# Patient Record
Sex: Male | Born: 1965
Health system: Southern US, Community
[De-identification: ages and names within clinical notes are randomized; demographics above are authoritative.]

## PROBLEM LIST (undated history)

## (undated) DIAGNOSIS — J449 Chronic obstructive pulmonary disease, unspecified: Secondary | ICD-10-CM

## (undated) DIAGNOSIS — K649 Unspecified hemorrhoids: Secondary | ICD-10-CM

## (undated) HISTORY — PX: NO PAST SURGERIES: SHX2092

## (undated) HISTORY — DX: Unspecified hemorrhoids: K64.9

---

## 2017-03-23 ENCOUNTER — Encounter: Payer: Self-pay | Admitting: Nurse Practitioner

## 2017-03-23 ENCOUNTER — Ambulatory Visit (INDEPENDENT_AMBULATORY_CARE_PROVIDER_SITE_OTHER): Payer: Commercial Managed Care - PPO | Admitting: Nurse Practitioner

## 2017-03-23 ENCOUNTER — Ambulatory Visit
Admission: RE | Admit: 2017-03-23 | Discharge: 2017-03-23 | Disposition: A | Discharge status: Home / Self Care | Payer: Self-pay | Source: Ambulatory Visit | Attending: Nurse Practitioner | Admitting: Nurse Practitioner

## 2017-03-23 VITALS — BP 116/69 | HR 70 | Temp 97.9°F | Resp 16 | Wt 130.6 lb

## 2017-03-23 DIAGNOSIS — R05 Cough: Secondary | ICD-10-CM

## 2017-03-23 DIAGNOSIS — R61 Generalized hyperhidrosis: Secondary | ICD-10-CM | POA: Diagnosis not present

## 2017-03-23 DIAGNOSIS — H65191 Other acute nonsuppurative otitis media, right ear: Secondary | ICD-10-CM

## 2017-03-23 DIAGNOSIS — Z23 Encounter for immunization: Secondary | ICD-10-CM | POA: Diagnosis not present

## 2017-03-23 DIAGNOSIS — R053 Chronic cough: Secondary | ICD-10-CM

## 2017-03-23 DIAGNOSIS — R918 Other nonspecific abnormal finding of lung field: Secondary | ICD-10-CM | POA: Insufficient documentation

## 2017-03-23 DIAGNOSIS — H7292 Unspecified perforation of tympanic membrane, left ear: Secondary | ICD-10-CM | POA: Insufficient documentation

## 2017-03-23 DIAGNOSIS — F17210 Nicotine dependence, cigarettes, uncomplicated: Secondary | ICD-10-CM | POA: Diagnosis not present

## 2017-03-23 DIAGNOSIS — G44321 Chronic post-traumatic headache, intractable: Secondary | ICD-10-CM

## 2017-03-23 DIAGNOSIS — Z7689 Persons encountering health services in other specified circumstances: Secondary | ICD-10-CM

## 2017-03-23 LAB — CBC WITH DIFFERENTIAL/PLATELET
Basophils Absolute: 32 cells/uL (ref 0–200)
Basophils Relative: 0.5 %
Eosinophils Absolute: 202 cells/uL (ref 15–500)
Eosinophils Relative: 3.2 %
HCT: 43.7 % (ref 38.5–50.0)
Hemoglobin: 14.9 g/dL (ref 13.2–17.1)
Lymphs Abs: 2356 cells/uL (ref 850–3900)
MCH: 32 pg (ref 27.0–33.0)
MCHC: 34.1 g/dL (ref 32.0–36.0)
MCV: 94 fL (ref 80.0–100.0)
MPV: 11.8 fL (ref 7.5–12.5)
Monocytes Relative: 6.3 %
Neutro Abs: 3314 cells/uL (ref 1500–7800)
Neutrophils Relative %: 52.6 %
Platelets: 196 10*3/uL (ref 140–400)
RBC: 4.65 10*6/uL (ref 4.20–5.80)
RDW: 13.3 % (ref 11.0–15.0)
Total Lymphocyte: 37.4 %
WBC mixed population: 397 cells/uL (ref 200–950)
WBC: 6.3 10*3/uL (ref 3.8–10.8)

## 2017-03-23 LAB — COMPREHENSIVE METABOLIC PANEL
AG Ratio: 1.6 (calc) (ref 1.0–2.5)
ALT: 12 U/L (ref 9–46)
AST: 18 U/L (ref 10–35)
Albumin: 4.2 g/dL (ref 3.6–5.1)
Alkaline phosphatase (APISO): 45 U/L (ref 40–115)
BUN: 17 mg/dL (ref 7–25)
CO2: 27 mmol/L (ref 20–32)
Calcium: 9.7 mg/dL (ref 8.6–10.3)
Chloride: 107 mmol/L (ref 98–110)
Creat: 0.87 mg/dL (ref 0.70–1.33)
Globulin: 2.7 g/dL (calc) (ref 1.9–3.7)
Glucose, Bld: 82 mg/dL (ref 65–139)
Potassium: 3.7 mmol/L (ref 3.5–5.3)
Sodium: 142 mmol/L (ref 135–146)
Total Bilirubin: 0.3 mg/dL (ref 0.2–1.2)
Total Protein: 6.9 g/dL (ref 6.1–8.1)

## 2017-03-23 MED ORDER — DOXYCYCLINE HYCLATE 100 MG PO TABS: 100.0000 mg | ORAL_TABLET | Freq: Two times a day (BID) | ORAL | 0 refills | Status: DC
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
## 2017-03-23 NOTE — Patient Instructions (Addendum)
Melvin Ramos, Thank you for coming in to clinic today.  1. For your headaches: - Start taking Tylenol extra strength 1 to 2 tablets every 6-8 hours for aches or fever/chills for next few days as needed.  Do not take more than 3,000 mg in 24 hours from all medicines.  May take Ibuprofen as well if tolerated 200-400mg  every 8 hours as needed. - We are also sending a referral to neurology for additional headache evaluation.  2. For your cough: - Chest Xray today - Tb skin test was placed today.  Please return on Friday after 9:30 am to have this read.  3. You have no hearing in your left ear because of a chronically ruptured ear drum.    4. You also have an infection of your right ear.  Take doxycycline 100 mg tablet twice daily (about every 12 hours) for 10 days.  Please schedule a follow-up appointment with Cassell Smiles, AGNP. Return in about 2 days (around 03/25/2017) for Tb skin test check AND in about 3 months for annual physical.  If you have any other questions or concerns, please feel free to call the clinic or send a message through [place]. You may also schedule an earlier appointment if necessary.  You will receive a survey after today's visit either digitally by e-mail or paper by [employer]. Your experiences and feedback matter to [place].  Please respond so we know how we are doing as we provide care for you.   Cassell Smiles, DNP, AGNP-BC Adult Gerontology Nurse Practitioner [facility]    Eardrum Rupture, Adult An eardrum rupture is a hole (perforation) in the eardrum. The eardrum is a thin, round tissue inside of the ear that separates the ear canal from the middle ear. The eardrum is also called the tympanic membrane. It transfers sound vibrations through small bones in the middle ear to the hearing nerve in the inner ear. It also protects the middle ear from germs. An eardrum rupture can cause pain and hearing loss. What are the causes? This condition may be  caused by:  An infection.  A sudden injury, such as from: ? Inserting a thin, sharp object into the ear. ? A hit to the side of the head, especially by an open hand. ? Falling onto water or a flat surface. ? A rapid change in pressure, such as from flying or scuba diving. ? A sudden increase in pressure against the eardrum, such as from an explosion or a very loud noise.  Inserting a cotton-tipped swab in the ear.  A long-term eustachian tube disorder. Eustachian tubes are parts of the body that connect each middle ear space to the back of the nose.  A medical procedure or surgery, such as a procedure to remove wax from the ear canal.  Removing a man-made pressure equalization tube(PE tube) that was placed through the eardrum.  Having a PE tube fall out.  What increases the risk? You are more likely to develop this condition if:  You have had PE tubes inserted in your ears.  You have an ear infection.  You play sports that: ? Involve balls or contact with other players. ? Take place in water, such as diving, scuba diving, or waterskiing.  What are the signs or symptoms? Symptoms of this condition include:  Sudden pain at the time of the injury.  Ear pain that suddenly improves.  Ringing in the ear after the injury.  Drainage from the ear. The drainage  may be clear, cloudy or pus-like, or bloody.  Hearing loss.  Dizziness.  How is this diagnosed? This condition is diagnosed based on your symptoms and medical history as well as a physical exam. Your health care provider can usually see a perforation using an ear scope (otoscope). You may have tests, such as:  A hearing test (audiogram) to check for hearing loss.  A test in which a sample of ear drainage is tested for infection (culture).  How is this treated? An eardrum typically heals on its own within a few weeks. If your eardrum does not heal, your health care provider may recommend a procedure to place a patch  over your eardrum or surgery to repair your eardrum. Your health care provider may also prescribe antibiotic medicines to help prevent infection. If the ear heals completely, any hearing loss should be temporary. Follow these instructions at home:  Keep your ear dry. This is very important. Follow instructions from your health care provider about how to keep your ear dry. You may need to wear waterproof earplugs when bathing and swimming.  Take over-the-counter and prescription medicines only as told by your health care provider.  Return to sports and activities as told by your health care provider. Ask your health care provider what activities are safe for you.  Wear headgear with ear protection when you play sports in which ear injuries are common.  If directed, apply heat to your affected ear as often as told by your health care provider. Use the heat source that your health care provider recommends, such as a moist heat pack or a heating pad. This will help to relieve pain. ? Place a towel between your skin and the heat source. ? Leave the heat on for 20-30 minutes. ? Remove the heat if your skin turns bright red. This is especially important if you are unable to feel pain, heat, or cold. You may have a greater risk of getting burned.  Keep all follow-up visits as told by your health care provider. This is important.  Talk to your health care provider before traveling by plane. Contact a health care provider if:  You have mucus or blood draining from your ear.  You have a fever.  You have ear pain.  You have hearing loss, dizziness, or ringing in your ear. Get help right away if:  You have sudden hearing loss.  You are very dizzy.  You have severe ear pain.  Your face feels weak or becomes paralyzed. Summary  An eardrum rupture is a hole (perforation) in the eardrum that can cause pain and hearing loss. It is usually caused by a sudden injury to the ear.  The eardrum  will likely heal on its own within a few weeks. In some cases, surgery may be necessary.  After the injury, follow instructions from your health care provider about how to keep your ear dry as it heals. This information is not intended to replace advice given to you by your health care provider. Make sure you discuss any questions you have with your health care provider. Document Released: 05/21/2000 Document Revised: 07/30/2016 Document Reviewed: 07/30/2016 Elsevier Interactive Patient Education  [employer].

## 2017-03-25 ENCOUNTER — Ambulatory Visit (INDEPENDENT_AMBULATORY_CARE_PROVIDER_SITE_OTHER): Payer: Commercial Managed Care - PPO | Admitting: Nurse Practitioner

## 2017-03-25 DIAGNOSIS — R7611 Nonspecific reaction to tuberculin skin test without active tuberculosis: Secondary | ICD-10-CM | POA: Diagnosis not present

## 2017-03-25 DIAGNOSIS — J410 Simple chronic bronchitis: Secondary | ICD-10-CM

## 2017-03-25 LAB — TB SKIN TEST
Induration: 22 mm
TB Skin Test: POSITIVE

## 2017-03-25 MED ORDER — ALBUTEROL SULFATE HFA 108 (90 BASE) MCG/ACT IN AERS: 1.0000 | INHALATION_SPRAY | Freq: Four times a day (QID) | RESPIRATORY_TRACT | 5 refills | Status: DC | PRN

## 2017-03-25 NOTE — Patient Instructions (Addendum)
Moe, Thank you for coming in to clinic today.  1. For your positive ppd/TB test, we are following up with a blood test to confirm any possible infection.  You will get a call for the results next week.  Please schedule a follow-up appointment with Cassell Smiles, AGNP. Return if symptoms worsen or fail to improve.  If you have any other questions or concerns, please feel free to call the clinic or send a message through [place]. You may also schedule an earlier appointment if necessary.  You will receive a survey after today's visit either digitally by e-mail or paper by [employer]. Your experiences and feedback matter to [place].  Please respond so we know how we are doing as we provide care for you.   Cassell Smiles, DNP, AGNP-BC Adult Gerontology Nurse Practitioner [place]

## 2017-03-25 NOTE — Progress Notes (Signed)
Subjective:    Patient ID: Melvin Ramos, male    DOB: 1965-08-13, 51 y.o.   MRN: [id number]  Melvin Ramos is a 51 y.o. male presenting on 03/25/2017 for PPD Reading   HPI PPD reading Pt was in clinic today for PPD reading w/ positive result.  PPD induration 22 mm left forearm.  Pt has no symptoms of night sweats or productive cough.  No contacts or people who live with Melvin Ramos have cough.  There has been no change in pt's symptoms over last 2 days.  Also requests recheck of ears.  Social History  Substance Use Topics  . Smoking status: Current Every Day Smoker    Packs/day: 1.00    Years: 30.00  . Smokeless tobacco: Never Used  . Alcohol use No    Review of Systems Per HPI unless specifically indicated above     Objective:    There were no vitals taken for this visit.  Wt Readings from Last 3 Encounters:  03/23/17 130 lb 9.6 oz (59.2 kg)    Physical Exam  General - healthy, well-appearing, NAD HEENT - Normocephalic, atraumatic, PERRL, EOMI, patent nares w/o congestion, oropharynx clear, MMM, Right TM erythematous but improving, Left TM less erythematous and w/ persistent chronic TM rupture w/o hearing.  Ear canal normal, external ear normal w/o lesions Skin - warm, dry Neuro - awake, alert,  Psych - Normal mood and affect, normal behavior    Results for orders placed or performed in visit on 03/23/17  CBC with Differential/Platelet  Result Value Ref Range   WBC 6.3 3.8 - 10.8 Thousand/uL   RBC 4.65 4.20 - 5.80 Million/uL   Hemoglobin 14.9 13.2 - 17.1 g/dL   HCT 43.7 38.5 - 50.0 %   MCV 94.0 80.0 - 100.0 fL   MCH 32.0 27.0 - 33.0 pg   MCHC 34.1 32.0 - 36.0 g/dL   RDW 13.3 11.0 - 15.0 %   Platelets 196 140 - 400 Thousand/uL   MPV 11.8 7.5 - 12.5 fL   Neutro Abs 3,314 1,500 - 7,800 cells/uL   Lymphs Abs 2,356 850 - 3,900 cells/uL   WBC mixed population 397 200 - 950 cells/uL   Eosinophils Absolute 202 15 - 500 cells/uL   Basophils Absolute 32 0 - 200 cells/uL   Neutrophils Relative % 52.6 %   Total Lymphocyte 37.4 %   Monocytes Relative 6.3 %   Eosinophils Relative 3.2 %   Basophils Relative 0.5 %  Comprehensive metabolic panel  Result Value Ref Range   Glucose, Bld 82 65 - 139 mg/dL   BUN 17 7 - 25 mg/dL   Creat 0.87 0.70 - 1.33 mg/dL   BUN/Creatinine Ratio NOT APPLICABLE 6 - 22 (calc)   Sodium 142 135 - 146 mmol/L   Potassium 3.7 3.5 - 5.3 mmol/L   Chloride 107 98 - 110 mmol/L   CO2 27 20 - 32 mmol/L   Calcium 9.7 8.6 - 10.3 mg/dL   Total Protein 6.9 6.1 - 8.1 g/dL   Albumin 4.2 3.6 - 5.1 g/dL   Globulin 2.7 1.9 - 3.7 g/dL (calc)   AG Ratio 1.6 1.0 - 2.5 (calc)   Total Bilirubin 0.3 0.2 - 1.2 mg/dL   Alkaline phosphatase (APISO) 45 40 - 115 U/L   AST 18 10 - 35 U/L   ALT 12 9 - 46 U/L  PPD  Result Value Ref Range   TB Skin Test Positive    Induration 22 mm  03/23/17 Chest Xray reviewed personally and has no signs of active TB.  Radiology report is also consistent with these findings.    Assessment & Plan:   Problem List Items Addressed This Visit      Respiratory   Simple chronic bronchitis ([place])    Pt w/ mild obstruction noted on Xray yesterday and chronic cough.    Plan: 1. START albuterol inhaler 1-2 puffs every 6 hours as needed for cough, shortness of breath, or wheezing. 2. Follow up as needed.  May consider spirometry in future.      Relevant Medications   albuterol (PROVENTIL HFA;VENTOLIN HFA) 108 (90 Base) MCG/ACT inhaler    Other Visit Diagnoses    Positive PPD    -  Primary Pt has positive ppd w/ induration of 61mm.  Chronic cough is still present, but not worsening.  - 03/23/17 Chest Xray reviewed personally and has no signs of active TB.  Radiology report is also consistent with these findings. - No sick contacts or others in home w/ chronic cough which is encouraging that patient does not have any active TB. - No recall of vaccinations w/ possible BCG, but may be explanation for positive ppd if no TB  infection present.  Plan: 1. Notified [facility] Department 2. Placed order for collection today of Quantiferon Tb gold. 3. Reinforced symptoms, negative xray, lack of symptoms in contacts as good signs for no TB infection.  Must complete process with lab and if negative will not need treatment or any additional followup for TB.   4. Family present and served as interpreter.  Phone interpreter offered and declined. 5. Followup as needed.   Relevant Orders   Quantiferon tb gold assay      Meds ordered this encounter  Medications  . albuterol (PROVENTIL HFA;VENTOLIN HFA) 108 (90 Base) MCG/ACT inhaler    Sig: Inhale 1-2 puffs into the lungs every 6 (six) hours as needed for wheezing or shortness of breath.    Dispense:  1 Inhaler    Refill:  5    Order Specific Question:   Supervising Provider    Answer:   Olin Hauser [[id number]]    Follow up plan: Return if symptoms worsen or fail to improve.  Cassell Smiles, DNP, AGPCNP-BC Adult Gerontology Primary Care Nurse Practitioner [facility] 03/25/2017, 10:33 AM

## 2017-03-25 NOTE — Assessment & Plan Note (Signed)
Pt w/ mild obstruction noted on Xray yesterday and chronic cough.    Plan: 1. START albuterol inhaler 1-2 puffs every 6 hours as needed for cough, shortness of breath, or wheezing. 2. Follow up as needed.  May consider spirometry in future.

## 2017-03-26 LAB — QUANTIFERON TB GOLD ASSAY (BLOOD)
Mitogen-Nil: >10 IU/mL
QUANTIFERON(R)-TB GOLD: NEGATIVE
Quantiferon Nil Value: 0.14 IU/mL
Quantiferon Tb Ag Minus Nil Value: 0.13 IU/mL

## 2017-03-31 ENCOUNTER — Encounter: Payer: Self-pay | Admitting: Nurse Practitioner

## 2017-03-31 DIAGNOSIS — G44321 Chronic post-traumatic headache, intractable: Secondary | ICD-10-CM | POA: Insufficient documentation

## 2017-03-31 NOTE — Assessment & Plan Note (Addendum)
Pt w/ chronic, productive cough and smoking history of > 13 ppd.  Pt is poor historian, so history could be longer.  No screening for TB and in presence of night sweats, needs to be screened.  Chills and sweats also possibly from URI.  Plan: 1. Place ppd today.  Reading result is 54mm induration and is positive.  Followed up w/ Valma Cava, which was negative. 2. Chest Xray today.  Personal review of images shows pt w/ likely Mild obstruction and no active signs of TB. 3. Labs: CBC, CMP for evaluation of possible long-term infection. 4. Follow up as needed after labs.
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
## 2017-03-31 NOTE — Assessment & Plan Note (Signed)
Pt reports he smokes 1/3 ppd for the last 30 years and he started when very young, so history could be even longer than 30 years.  Pt is not ready to quit, but could be contributing to cough.  Plan: 1. See AP cough.

## 2017-03-31 NOTE — Assessment & Plan Note (Signed)
Left TM w/ appearance of chronically ruptured TM w/ persistent drainage.  Pt completely deaf in left ear.  Plan: 1. Consider ENT referral in future, especially if recurrent sinusitis/infections.  Pt and daughter defer for now since is chronic problem.  Possible association to a URI and right otitis media. 2. Followup as needed.

## 2017-06-27 ENCOUNTER — Encounter: Payer: Commercial Managed Care - PPO | Admitting: Nurse Practitioner

## 2018-02-09 ENCOUNTER — Other Ambulatory Visit: Payer: Self-pay | Admitting: Nurse Practitioner

## 2018-02-09 DIAGNOSIS — J410 Simple chronic bronchitis: Secondary | ICD-10-CM

## 2018-02-09 NOTE — Telephone Encounter (Signed)
Please call patient to schedule followup in 03/2018.  Last seen in 03/2017.

## 2018-03-02 ENCOUNTER — Other Ambulatory Visit: Payer: Self-pay | Admitting: Nurse Practitioner

## 2018-03-02 DIAGNOSIS — J410 Simple chronic bronchitis: Secondary | ICD-10-CM

## 2018-03-08 ENCOUNTER — Other Ambulatory Visit: Payer: Self-pay | Admitting: Nurse Practitioner

## 2018-03-08 DIAGNOSIS — J410 Simple chronic bronchitis: Secondary | ICD-10-CM

## 2018-03-08 NOTE — Telephone Encounter (Signed)
Advised daughter that patient needs appointment for further refill.

## 2018-03-08 NOTE — Telephone Encounter (Signed)
Once an appointment is scheduled, I will send a refill. He was instructed on 02/09/2018 that he needed to be seen prior to additional refills.

## 2018-03-15 ENCOUNTER — Other Ambulatory Visit: Payer: Self-pay

## 2018-03-15 ENCOUNTER — Encounter: Payer: Self-pay | Admitting: Nurse Practitioner

## 2018-03-15 ENCOUNTER — Other Ambulatory Visit: Payer: Self-pay | Admitting: Nurse Practitioner

## 2018-03-15 ENCOUNTER — Ambulatory Visit (INDEPENDENT_AMBULATORY_CARE_PROVIDER_SITE_OTHER): Payer: Commercial Managed Care - PPO | Admitting: Nurse Practitioner

## 2018-03-15 VITALS — BP 127/83 | HR 71 | Temp 98.1°F | Resp 17 | Ht 66.0 in | Wt 129.4 lb

## 2018-03-15 DIAGNOSIS — Z23 Encounter for immunization: Secondary | ICD-10-CM | POA: Diagnosis not present

## 2018-03-15 DIAGNOSIS — H6692 Otitis media, unspecified, left ear: Secondary | ICD-10-CM

## 2018-03-15 DIAGNOSIS — J410 Simple chronic bronchitis: Secondary | ICD-10-CM | POA: Diagnosis not present

## 2018-03-15 DIAGNOSIS — H7292 Unspecified perforation of tympanic membrane, left ear: Secondary | ICD-10-CM | POA: Diagnosis not present

## 2018-03-15 MED ORDER — AMOXICILLIN 500 MG PO TABS: 500.0000 mg | ORAL_TABLET | Freq: Two times a day (BID) | ORAL | 0 refills | Status: AC

## 2018-03-15 MED ORDER — UMECLIDINIUM BROMIDE 62.5 MCG/INH IN AEPB: 1.0000 | INHALATION_SPRAY | Freq: Every day | RESPIRATORY_TRACT | 6 refills | Status: DC

## 2018-03-15 NOTE — Patient Instructions (Addendum)
Melvin Ramos,   Thank you for coming in to clinic today.  1. You most likely have COPD with cough - Continue your current inhaler only as a rescue inhaler when you have trouble breathing.  - START Incruse 1 inhalation once daily.   If this medication is not covered, Symbicort will be and it will be 2 puffs twice daily.  Follow the instructions on the box.  Before lung test, do not use Albuterol for at least 12 hours.   - Do not take the new inhaler for at least 48 hours.  2. LEFT ear drainage - START Amoxicillin 500 mg one tablet every 12 hours for 10 days.  Please schedule a follow-up appointment with Cassell Smiles, AGNP. Return in about 6 months (around 09/14/2018) for COPD.  Call if we need to see you sooner for your cough if it is not improving.  If you have any other questions or concerns, please feel free to call the clinic or send a message through [place]. You may also schedule an earlier appointment if necessary.  You will receive a survey after today's visit either digitally by e-mail or paper by [employer]. Your experiences and feedback matter to [place].  Please respond so we know how we are doing as we provide care for you.   Cassell Smiles, DNP, AGNP-BC Adult Gerontology Nurse Practitioner [facility]   C?n k?ch pht b?nh ph?i t?c ngh?n m?n tnh Chronic Obstructive Pulmonary Disease Exacerbation B?nh ph?i t?c ngh?n m?n tnh (COPD) l tnh tr?ng ko di (m?n tnh) ?nh h??ng ??n ph?i. COPD l m?t thu?t ng? chung c th? ???c s? d?ng ?? m t? nhi?u v?n ?? v? ph?i khc gy s?ng ph?i (vim) v h?n ch? dng kh, k? c? vim ph? qu?n m?n tnh v b?nh kh ph? th?ng. C?n k?ch pht COPD l nh?ng ??t khi m cc tri?u ch?ng h h?p n?ng h?n nhi?u v c?n ph?i ???c ?i?u tr? thm. C?n k?ch pht COPD th??ng do nhi?m trng gy ra. N?u khng c ?i?u tr?, c?n k?ch pht COPD c th? n?ng v th?m ch ?e d?a ??n tnh m?ng. Cc c?n k?ch pht COPD th??ng xuyn c th? gy t?n th??ng thm  cho ph?i. Nguyn nhn g gy ra? Tnh tr?ng ny c th? do:  Nhi?m trng ???ng h h?p, g?m c? nhi?m vi rt v nhi?m khu?n.  Ti?p xc v?i khi thu?c.  Ti?p xc v?i khng kh  nhi?m, h?i ha ch?t, ho?c b?i.  Nh?ng th? lm cho qu v? c ph?n ?ng d? ?ng (ch?t gy d? ?ng).  Khng dng thu?c ?i?u tr? COPD ??u ??n theo ch? d?n.  Cc v?n ?? b?nh l ti?m tng, ch?ng h?n nh? suy tim sung huy?t ho?c nhi?m trng khng lin quan ??n ph?i.  Trong nhi?u tr??ng h?p, khng r nguyn nhn (kh?i pht) c?a tnh tr?ng ny. ?i?u g lm t?ng nguy c?? Nh?ng y?u t? sau c th? lm qu v? d? b? tnh tr?ng ny h?n:  Ht thu?c l.  Tu?i gi.  Cc c?n k?ch pht COPD th??ng xuyn tr??c ?.  Cc d?u hi?u ho?c tri?u ch?ng l g? Nh?ng tri?u ch?ng c?a tnh tr?ng ny bao g?m:  Ho t?ng ln.  T?ng ti?t d?ch nh?y t? ph?i (??m).  Th? kh kh h?n.  Kh th? h?n.  Th? nhanh ho?c th? n?ng nh?c.  T?c ng?c.  Sinh l?c km h?n bnh th??ng.  Gin ?o?n gi?c ng? do cc tri?u ch?ng gy ra.  L  l?n ho?c bu?n ng? t?ng ln.  Nh?ng tri?u ch?ng ny th??ng x?y ra ho?c n?ng h?n ngay c? khi dng thu?c. Ch?n ?on tnh tr?ng ny nh? th? no? Tnh tr?ng ny ???c ch?n ?on d?a vo:  B?nh s? c?a qu v?.  Khm th?c th?Sander Nephew v? c?ng c th? ???c lm cc ki?m tra, bao g?m:  Ch?p X-quang ng?c.  Xt nghi?m mu.  Ki?m tra ch?c n?ng ph?i (ph?i).  Tnh tr?ng ny ???c ?i?u tr? nh? th? no? Vi?c ?i?u tr? b?nh ny ty thu?c vo nguyn nhn v m?c ?? n?ng c?a cc tri?u ch?ng. Qu v? c th? c?n ???c nh?p vi?n ?? ?i?u tr?Marland Kitchen M?t s? bi?n php ?i?u tr? ???c dng ?? ?i?u tr? c?n k?ch pht COPD l:  Thu?c khng sinh. Thu?c khng sinh c th? ???c dng cho cc c?n k?ch pht n?ng do nhi?m trng ph?i gy ra, ch?ng h?n nh? vim ph?i.  Thu?c gin ph? qu?n. ?y l thu?c d?ng ht c tc d?ng m? r?ng ???ng d?n kh v cho php dng kh l?n h?n ?i qua.  Cc lo?i thu?c steroid. Thu?c ny c tc d?ng gi?m vim trong ???ng th?. Thu?c c th?  ???c dng qua ?ng ht, ???ng u?ng, ho?c qua ???ng truy?n t?nh m?ch lu?n vo m?t trong nh?ng t?nh m?ch c?a qu v?.  Li?u php  xi b? sung.  Cc k? thu?t lm khai thng ???ng th?, ch?ng h?n nh? thng kh khng xm l?n (NIV) v p l?c th? ra d??ng (PEP). Nh?ng k? thu?t ny cung c?p h? tr? h h?p thng qua m?t n? ho?c thi?t b? khng xm l?n khc. V d? nh? s? s? d?ng my p su?t ???ng th? d??ng (CPAP) lin t?c ?? c?i thi?n vi?c ??a  xi vo ph?i qu v?.  Tun th? nh?ng h??ng d?n ny ? nh: Thu?c  Ch? s? d?ng thu?c khng k ??n v thu?c k ??n theo ch? d?n c?a chuyn gia ch?m [place] s?c kh?e. ?i?u quan tr?ng l ph?i s? d?ng ?ng k? thu?t k?t h?p v?i thu?c d?ng ht.  N?u qu v? ???c k thu?c khng sinh, steroid d?ng u?ng, hy dng thu?c theo ch? d?n c?a chuyn gia ch?m [place] s?c kh?e. Khng d?ng dng thu?c ngay c? khi qu v? b?t ??u c?m th?y ?? h?n. L?i s?ng  p d?ng ch? ?? ?n u?ng kh?e m?nh.  T?p th? d?c th??ng xuyn.  Ng? th?t nhi?u.  Trnh ti?p xc v?i t?t c? cc ch?t gy kch ?ng ???ng th?, ??c bi?t l khi thu?c l.  R?a tay th??ng xuyn b?ng x phng v n??c ?? gi?m nguy c? nhi?m trng. N?u khng c x phng v n??c, hy dng thu?c st trng tay.  Vo ma cm, trnh ??n nh?ng n?i c khng gian kn ?ng ng??i. H??ng d?n chung  U?ng ?? n??c ?? n??c ti?u lun trong ho?c mu vng nh?t (tr? khi qu v? c b?nh l c?n ph?i h?n ch? u?ng n??c).  S? d?ng nnh phun s??ng mt. My ny lm ?m khng kh v gip qu v? d? lm s?ch ng?c h?n khi ho.  N?u qu v? c my kh dung v  xi ? nh,hy ti?p t?c s? d?ng theo ch? d?n c?a chuyn gia ch?m [place] s?c kh?e.  Tun th? t?t c? cc l?n khm theo di theo ch? d?n c?a chuyn gia ch?m [place] s?c kh?e. ?i?u ny c vai tr quan tr?ng. Ng?n ng?a tnh tr?ng ny b?ng cch no?  C?p nh?t cc lo?i v?c-xin  ph? c?u khu?n v cm (cm). Qu v? nn tim phng cm hng n?m ?? gip phng ng?a cc c?n k?ch pht.  Khng s? d?ng b?t k? s?n ph?m no ch?a nicotine ho?c thu?c  l, ch?ng ha?n nh? thu?c l d?ng ht v thu?c l ?i?n t?. B? thu?c l c vai tr r?t quan tr?ng trong vi?c phng ng?a COPD tr? nn tr?m tr?ng h?n v phng ng?a cc c?n k?ch pht x?y ra th??ng xuyn. N?u qu v? c?n gip ?? ?? cai thu?c, hy h?i chuyn gia ch?m [place] s?c kh?e.  Tun theo t?t c? cc ch? d?n v? ph?c h?i ch?c n?ng ph?i sau m?t c?n k?ch pht g?n ?y. Lm nh? v?y c th? gip phng ng?a cc c?n k?ch pht trong t??ng lai.  Lm vi?c v?i chuyn gia ch?m [place] s?c kh?e ?? l?p v tun th? m?t k? ho?ch hnh ??ng. K? ho?ch ny cho qu v? bi?t c?n th?c hi?n bi?n php g khi qu v? b? cc tri?u ch?ng nh?t ??nh. Hy lin l?c v?i chuyn gia ch?m [place] s?c kh?e n?u:  Cc tri?u ch?ng COPD thng th??ng c?a qu v? tr?m tr?ng h?n. Yu c?u tr? gip ngay l?p t?c n?u:  Qu v? b? kh th? h?n, ngay c? khi ngh? ng?i.  Qu v? ni chuy?n kh kh?n.  Qu v? b? ?au ng?c r?t nhi?u.  Qu v? ho ra mu.  Qu v? b? s?t.  Qu v? b? y?u ?t, i m?a l?p ?i l?p l?i, ho?c ng?t x?u.  Qu v? c?m th?y l l?n.  Qu v? khng th? ng? do cc tri?u ch?ng gy ra.  Quy? vi? kho? th?c hi?n ca?c sinh hoa?t th???ng nga?y. Tm t?t  C?n k?ch pht COPD l nh?ng ??t khi m cc tri?u ch?ng h h?p tr?m tr?ng h?n nhi?u v c?n ph?i ???c ?i?u tr? thm, h?n m?c ?i?u tr? thng th??ng c?a qu v?.  C?n k?ch pht c th? nghim tr?ng v th?m ch ?e d?a ??n tnh m?ng. Cc c?n k?ch pht COPD th??ng xuyn c th? gy t?n th??ng thm cho ph?i.  Cc c?n k?ch pht COPD th??ng do nhi?m trng nh? cm, c?m l?nh v th?m ch vim ph?i gy ra.  Vi?c ?i?u tr? b?nh ny ty thu?c vo nguyn nhn v m?c ?? n?ng c?a cc tri?u ch?ng. Qu v? c th? c?n ???c nh?p vi?n ?? ?i?u tr?Marland Kitchen  B? thu?c l c vai tr r?t quan tr?ng trong vi?c ng?n ng?a COPD tr? nn tr?m tr?ng h?n v phng ng?a cc c?n k?ch pht x?y ra th??ng xuyn. Thng tin ny khng nh?m m?c ?ch thay th? cho l?i khuyn m chuyn gia ch?m [place] s?c kh?e ni v?i qu v?. Hy b?o ??m qu v? ph?i th?o lu?n b?t  k? v?n ?? g m qu v? c v?i chuyn gia ch?m [place] s?c kh?e c?a qu v?. Document Released: 09/15/2015 Document Revised: 09/21/2016 Document Reviewed: 09/21/2016 Elsevier Interactive Patient Education  2018 [place] mng nh? (Eardrum Rupture, Adult) Th?ng mng nh? l tnh tr?ng th?ng ho?c rch mng nh?. Tnh tr?ng ny cn ???c g?i l mng nh? b? th?ng. Mng nh? l m?t m m?ng, trn bn trong tai ng?n ?ng tai v?i tai gi?a c?a qu v?. ?y l m c ch?c n?ng pht hi?n m thanh v gip qu v? nghe ???c. Th?ng mng nh? c th? gy c?m gic kh ch?u v ?i?c. Trong h?u h?t cc tr??ng h?p, mng nh? s? lnh l?i m khng c?n ?i?u  tr? v thnh l?c b? m?t m?t ph?n ho?c khng b? m?t v?nh vi?n. NGUYN NHN Th?ng mng nh? c th? do nhi?u nguyn nhn khc nhau, bao g?m:  Thay ??i p l?c b?t ng? x?y ra trong cc tnh hu?ng nh? l?n ? bi?n ho?c ?i my bay.  D? v?t trong tai.  Ngoy t?m bng ho?c b?t k? v?t c ??u t no vo trong tai.  Ti?ng ?n l?n.  Kh?i u trong tai.  C? g?ng l?y m?t v?t ra kh?i tai. D?U HI?U V TRI?U CH?NG  ?i?c.  ?au tai.   tai.  Ti?t d?ch ho?c ch?y mu ? tai.  Chng m?t.  Nn.  Li?t ? m?t. CH?N ?ON  Chuyn gia ch?m [place] s?c kh?e s? khm tai qu v? b?ng cch s? d?ng m?t d?ng c? g?i l ?ng soi tai. D?ng c? ny cho php chuyn gia ch?m [place] s?c kh?e nhn vo trong tai ?? khm mng nh?. C th? th?c hi?n nh?ng ki?u ki?m tra thnh l?c khc nhau. ?I?U TR?  Thng th??ng, mng nh? s? t? lnh l?i trong vng vi tu?n. N?u mng nh? khng lnh l?i, chuyn gia ch?m [place] s?c kh?e c?a qu v? c th? khuy?n ngh? m?t trong cc bi?n php ?i?u tr? sau:  Th? thu?t ??t mi?ng v vo mng nh? c?a qu v?.  Ph?u thu?t ?? v mng nh?. H??NG D?N CH?M [place] T?I NH   Gi? tai kh. Vi?c ny s? gip mng nh? li?n nhanh h?n. Khng nhng ??u ng?p xu?ng n??c cho ??n khi mng nh? li?n h?n. Khng b?i cho ??n khi chuyn gia ch?m [place] s?c kh?e ch?p thu?n. Trong khi t?m b?n ho?c t?m vi hoa sen, hy  b?o v? tai b?ng m?t trong cc ph??ng php sau: ? S? d?ng m?t nt tai ch?ng th?m n??c. ? Bi m?t l?p vaseline d?ng gel ln mi?ng bng v ??t vo ?ng tai ngoi c?a qu v?.  Ch? s? d?ng thu?c theo ch? d?n c?a chuyn gia ch?m [place] s?c kh?e.  Trnh x m?i n?u c th?. N?u qu v? x m?i, hy x nh? nhng. Vi?c x m?i m?nh lm t?ng p l?c ? tai gi?a c?a qu v?. Vi?c ny c th? gy thm th??ng t?n ho?c c th? lm ch?m l?i qu trnh li?n v?t th??ng.  Tr? l?i cc ho?t ??ng bnh th??ng c?a qu v? sau khi v?t th?ng ? li?n. Chuyn gia ch?m [place] s?c kh?e c th? ni cho qu v? bi?t khi no ?i?u ny x?y ra.  Ni cho chuyn gia ch?m [place] s?c kh?e bi?t tr??c khi qu v? ?i my bay. ?i l?i b?ng ???ng hng khng th??ng v?n ???c php khi b? th?ng mng nh?Athena Masse th? m?i cu?c h?n khm l?i theo ch? d?n c?a chuyn gia ch?m [place] s?c kh?e. ?i?u ny c vai tr quan tr?ng. ?I KHM N?U:  Qu v? b? s?t. NGAY L?P T?C ?I KHM N?U:  Qu v? c mu ho?c m? ch?y ? tai.  Qu v? b? chng m?t ho?c g?p v?n ?? v? th?ng b?ng.  Qu v? b? bu?n nn ho?c nn m?a.  Qu v? b? ?au nhi?u h?n. Thng tin ny khng nh?m m?c ?ch thay th? cho l?i khuyn m chuyn gia ch?m [place] s?c kh?e ni v?i qu v?. Hy b?o ??m qu v? ph?i th?o lu?n b?t k? v?n ?? g m qu v? c v?i chuyn gia ch?m [place] s?c kh?e c?a qu v?. Document Released: 05/24/2005 Document Revised: 06/14/2014 Elsevier Interactive Patient  Education  2017 Elsevier Inc.  

## 2018-03-15 NOTE — Progress Notes (Signed)
Subjective:    Patient ID: Melvin Ramos, male    DOB: 27-Mar-1966, 52 y.o.   MRN: [id number]  Melvin Ramos is a 52 y.o. male presenting on 03/15/2018 for Cough (chronic cough) and Ear Drainage (left ear drainage x 3 mths ) Interpreter declined today.  Family member: Melvin Ramos is acting as Melvin Ramos.  HPI Chronic cough 3-4 times per day uses Albuterol.  Is not taking any maintenance inhaler.  Patient is current smoker of 1/3 pack/day and has been a smoker for over 30 years.  He has regular shortness of breath with activity, currently relieved by albuterol as needed.  LEFT ear drainage - He also has lost some hearing.  This has been present > 4-5 years.  In [place] had seen an Ear doctor is on a regular basis.  He would like to resume your specialty care here in the [place]. -New complaint is that he is having nearly constant drainage from his left ear.  He denies any fever chills or sweats.  He also denies any other URI symptoms.  Social History   Tobacco Use  . Smoking status: Current Every Day Smoker    Packs/day: 0.33    Years: 30.00    Pack years: 9.90  . Smokeless tobacco: Never Used  Substance Use Topics  . Alcohol use: Yes    Comment: "sometimes"  . Drug use: No    Review of Systems Per HPI unless specifically indicated above     Objective:    BP 127/83 (BP Location: Right Arm, Patient Position: Sitting, Cuff Size: Normal)   Pulse 71   Temp 98.1 F (36.7 C) (Oral)   Resp 17   Ht 5\' 6"  (1.676 m)   Wt 129 lb 6.4 oz (58.7 kg)   SpO2 98%   BMI 20.89 kg/m   Wt Readings from Last 3 Encounters:  03/15/18 129 lb 6.4 oz (58.7 kg)  03/23/17 130 lb 9.6 oz (59.2 kg)    Physical Exam  Constitutional: He is oriented to person, place, and time. He appears well-developed and well-nourished. No distress.  HENT:  Head: Normocephalic and atraumatic.  Right Ear: Hearing, tympanic membrane, external ear and ear canal normal.  Left Ear: External ear and ear canal normal. There  is drainage (clear drainage of fluid). No mastoid tenderness. Tympanic membrane is perforated (chronic rupture of TM) and erythematous (mildly erythematous tissue overlying middle ear structures). Decreased hearing (maintains BC, no AC  Weber > to left ear) is noted.  Cardiovascular: Normal rate, regular rhythm, S1 normal, S2 normal, normal heart sounds and intact distal pulses.  Pulmonary/Chest: Effort normal. No respiratory distress. He has decreased breath sounds. He has wheezes (throughout all lobes). He has rhonchi (throughout all lobes). He has rales (throughout all lobes).  Neurological: He is alert and oriented to person, place, and time.  Skin: Skin is warm and dry.  Psychiatric: He has a normal mood and affect. His behavior is normal.  Vitals reviewed.   Results for orders placed or performed in visit on 03/25/17  Quantiferon tb gold assay  Result Value Ref Range   QUANTIFERON(R)-TB GOLD NEGATIVE NEGATIVE   Quantiferon Nil Value 0.14 IU/mL   Mitogen-Nil >10.00 IU/mL   Quantiferon Tb Ag Minus Nil Value 0.13 IU/mL      Assessment & Plan:   Problem List Items Addressed This Visit      Respiratory   Simple chronic bronchitis ([place]) - Primary Currently uncontrolled with significant adventitious breath sounds.  No current evidence  of exacerbation.  Patient without daily maintenance inhaler use.  Overuse of SABA currently.  Continues smoking and increasing risk of worsening COPD.  Plan: 1. START Incruse 1 puff once daily 2. Continue albuterol 1-2 puffs 4 times daily prn.  Instructed to use only as needed. 3. PFT ordered for staging 4. Follow-up 6 months    Relevant Medications   umeclidinium bromide (INCRUSE ELLIPTA) 62.5 MCG/INH AEPB   Other Relevant Orders   Pulmonary Function Test [facility] Only    Other Visit Diagnoses    Chronic otitis media with perforated tympanic membrane, left     Patient with chronic perforation of LEFT TM now with otitis media as evidenced by erythema and  drainage.    Plan: 1. Start amoxicillin 500 mg bid x 10 days 2. Defer ciprodex at this time as TM is not intact 3. Refer to ENT for followup. 4. Follow-up in clinic prn.   Relevant Medications   amoxicillin (AMOXIL) 500 MG tablet   Other Relevant Orders   Ambulatory referral to ENT   Needs flu shot     Pt < age 4.  Needs annual influenza vaccine.  Plan: 1. Administer Quad flu vaccine.    Relevant Orders   Flu Vaccine QUAD 6+ mos PF IM (Fluarix Quad PF) (Completed)      Meds ordered this encounter  Medications  . umeclidinium bromide (INCRUSE ELLIPTA) 62.5 MCG/INH AEPB    Sig: Inhale 1 puff into the lungs daily.    Dispense:  30 each    Refill:  6    Order Specific Question:   Supervising Provider    Answer:   Olin Hauser [[id number]]  . amoxicillin (AMOXIL) 500 MG tablet    Sig: Take 1 tablet (500 mg total) by mouth every 12 (twelve) hours for 10 days.    Dispense:  20 tablet    Refill:  0    Order Specific Question:   Supervising Provider    Answer:   Olin Hauser [[id number]]    Follow up plan: Return in about 6 months (around 09/14/2018) for COPD.  Cassell Smiles, DNP, AGPCNP-BC Adult Gerontology Primary Care Nurse Practitioner [facility] 03/15/2018, 8:57 AM

## 2018-03-18 ENCOUNTER — Encounter: Payer: Self-pay | Admitting: Nurse Practitioner

## 2018-04-09 ENCOUNTER — Other Ambulatory Visit: Payer: Self-pay | Admitting: Nurse Practitioner

## 2018-04-09 DIAGNOSIS — J410 Simple chronic bronchitis: Secondary | ICD-10-CM

## 2018-06-03 ENCOUNTER — Other Ambulatory Visit: Payer: Self-pay | Admitting: Nurse Practitioner

## 2018-06-03 DIAGNOSIS — J410 Simple chronic bronchitis: Secondary | ICD-10-CM

## 2018-06-20 ENCOUNTER — Other Ambulatory Visit: Payer: Self-pay

## 2018-06-20 ENCOUNTER — Ambulatory Visit (INDEPENDENT_AMBULATORY_CARE_PROVIDER_SITE_OTHER): Payer: Commercial Managed Care - PPO | Admitting: Nurse Practitioner

## 2018-06-20 ENCOUNTER — Encounter: Payer: Self-pay | Admitting: Nurse Practitioner

## 2018-06-20 VITALS — BP 102/68 | HR 73 | Temp 97.9°F | Resp 17 | Ht 66.0 in | Wt 127.6 lb

## 2018-06-20 DIAGNOSIS — Z1159 Encounter for screening for other viral diseases: Secondary | ICD-10-CM | POA: Diagnosis not present

## 2018-06-20 DIAGNOSIS — Z0000 Encounter for general adult medical examination without abnormal findings: Secondary | ICD-10-CM

## 2018-06-20 DIAGNOSIS — Z1211 Encounter for screening for malignant neoplasm of colon: Secondary | ICD-10-CM

## 2018-06-20 DIAGNOSIS — J410 Simple chronic bronchitis: Secondary | ICD-10-CM | POA: Diagnosis not present

## 2018-06-20 DIAGNOSIS — Z23 Encounter for immunization: Secondary | ICD-10-CM | POA: Diagnosis not present

## 2018-06-20 DIAGNOSIS — Z114 Encounter for screening for human immunodeficiency virus [HIV]: Secondary | ICD-10-CM

## 2018-06-20 MED ORDER — TIOTROPIUM BROMIDE MONOHYDRATE 18 MCG IN CAPS: 18.0000 ug | ORAL_CAPSULE | Freq: Every day | RESPIRATORY_TRACT | 12 refills | Status: DC

## 2018-06-20 NOTE — Progress Notes (Signed)
Subjective:    Patient ID: Melvin Ramos, male    DOB: 11/18/1965, 53 y.o.   MRN: [id number]  Melvin Ramos is a 53 y.o. male presenting on 06/20/2018 for Annual Exam (TYMPANOPLASTY WO MASTOIDEC SURGERY scheduled 07/18/2018)  Patient is accompanied today by his daughter, Melvin Ramos, who is interpreting. Telephone interpreter offered and declined.  Waiver previously signed.  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    COPD - Has not been able to keep Incruse daily due to cost.  Has had no shortness of breath regularly.  Uses rescue inhaler only every 2-3 days.    Annual Physical Exam Patient has been feeling well.  They have no acute concerns today. Sleeps approx 7 hours per night uninterrupted.  HEALTH MAINTENANCE: Weight/BMI: healthy Physical activity: regular Diet: generally healthy Prostate exam/PSA: due Colon Cancer Screen: due HIV/HEP C: due today Optometry: not regular Dentistry: not regular  VACCINES: Tetanus: done today Influenza: received 03/2018 Shingles: recommended  Social History   Tobacco Use  . Smoking status: Current Every Day Smoker    Packs/day: 1.00    Years: 30.00    Pack years: 30.00    Types: Cigarettes  . Smokeless tobacco: Never Used  Substance Use Topics  . Alcohol use: Yes    Comment: "sometimes"  . Drug use: No    Review of Systems  Constitutional: Negative for activity change, appetite change, fatigue and unexpected weight change.  HENT: Negative for congestion, hearing loss and trouble swallowing.   Eyes: Negative for visual disturbance.  Respiratory: Negative for choking, shortness of breath and wheezing.   Cardiovascular: Negative for chest pain and palpitations.  Gastrointestinal: Negative for abdominal pain, blood in stool, constipation and diarrhea.  Genitourinary: Negative for difficulty urinating, discharge, flank pain, genital sores, penile pain, penile swelling, scrotal  swelling and testicular pain.  Musculoskeletal: Negative for arthralgias, back pain and myalgias.  Skin: Negative for color change, rash and wound.  Allergic/Immunologic: Negative for environmental allergies.  Neurological: Negative for dizziness, seizures, weakness and headaches.  Psychiatric/Behavioral: Negative for behavioral problems, decreased concentration, dysphoric mood, sleep disturbance and suicidal ideas. The patient is not nervous/anxious.    Per HPI unless specifically indicated above     Objective:    BP 102/68 (BP Location: Left Arm, Patient Position: Sitting, Cuff Size: Normal)   Pulse 73   Temp 97.9 F (36.6 C) (Oral)   Resp 17   Ht 5\' 6"  (1.676 m)   Wt 127 lb 9.6 oz (57.9 kg)   SpO2 99%   BMI 20.60 kg/m   Wt Readings from Last 3 Encounters:  06/20/18 127 lb 9.6 oz (57.9 kg)  03/15/18 129 lb 6.4 oz (58.7 kg)  03/23/17 130 lb 9.6 oz (59.2 kg)    Physical Exam Vitals signs and nursing note reviewed.  Constitutional:      General: He is not in acute distress.    Appearance: He is well-developed.  HENT:     Head: Normocephalic and atraumatic.     Right Ear: External ear normal.     Left Ear: External ear normal.     Nose: Nose normal.  Eyes:     Conjunctiva/sclera: Conjunctivae normal.     Pupils: Pupils are equal, round, and reactive to light.  Neck:     Musculoskeletal: Normal range of motion and neck supple.     Thyroid: No thyromegaly.     Vascular: No JVD.     Trachea:  No tracheal deviation.  Cardiovascular:     Rate and Rhythm: Normal rate and regular rhythm.     Heart sounds: Normal heart sounds. No murmur. No friction rub. No gallop.   Pulmonary:     Effort: Pulmonary effort is normal. No respiratory distress.     Breath sounds: Normal breath sounds.  Abdominal:     General: Bowel sounds are normal. There is no distension.     Palpations: Abdomen is soft.     Tenderness: There is no abdominal tenderness.  Musculoskeletal: Normal range of  motion.  Lymphadenopathy:     Cervical: No cervical adenopathy.  Skin:    General: Skin is warm and dry.  Neurological:     Mental Status: He is alert and oriented to person, place, and time.     Cranial Nerves: No cranial nerve deficit.  Psychiatric:        Behavior: Behavior normal.        Thought Content: Thought content normal.        Judgment: Judgment normal.    Results for orders placed or performed in visit on 03/25/17  Quantiferon tb gold assay  Result Value Ref Range   QUANTIFERON(R)-TB GOLD NEGATIVE NEGATIVE   Quantiferon Nil Value 0.14 IU/mL   Mitogen-Nil >10.00 IU/mL   Quantiferon Tb Ag Minus Nil Value 0.13 IU/mL      Assessment & Plan:   Problem List Items Addressed This Visit      Respiratory   Simple chronic bronchitis ([place])   Relevant Medications   tiotropium (SPIRIVA HANDIHALER) 18 MCG inhalation capsule    Other Visit Diagnoses    Need for diphtheria-tetanus-pertussis (Tdap) vaccine    -  Primary   Relevant Orders   Tdap vaccine greater than or equal to 7yo IM (Completed)   Encounter for annual physical exam       Relevant Orders   Lipid panel   TSH   PSA   Hemoglobin A1c   Hepatitis C antibody   COMPLETE METABOLIC PANEL WITH GFR   CBC with Differential/Platelet   HIV Antibody (routine testing w rflx)   Encounter for hepatitis C screening test for low risk patient       Relevant Orders   Hepatitis C antibody   Screening for HIV without presence of risk factors       Relevant Orders   HIV Antibody (routine testing w rflx)   Colon cancer screening       Relevant Orders   Ambulatory referral to Gastroenterology    # Annual physical exam with no new findings.  Well adult with no acute concerns.  Plan: 1. Obtain health maintenance screenings as above according to age. - Increase physical activity to 30 minutes most days of the week.  - Eat healthy diet high in vegetables and fruits; low in refined carbohydrates. - Colonoscopy screening  scheduled for this year - referral placed to try to complete in about 3-4 months after tympanic membrane surgery. - Labs as ordered - patient did have single glass milk this morning and is not fully fasting. 2. Return 1 year for annual physical.  # COPD Stable simple chronic bronchitis.  Needs PFT this year for evaluation of status.  - Continue with Spiriva due to cost of Incruse.  Copay card provided. - Continue albuterol prn.   - Follow-up 1 year and prn.  Meds ordered this encounter  Medications  . tiotropium (SPIRIVA HANDIHALER) 18 MCG inhalation capsule    Sig: Place 1  capsule (18 mcg total) into inhaler and inhale daily.    Dispense:  30 capsule    Refill:  12    Order Specific Question:   Supervising Provider    Answer:   Olin Hauser [[id number]]    Follow up plan: Return in about 1 year (around 06/21/2019) for annual physical, COPD.  Cassell Smiles, DNP, AGPCNP-BC Adult Gerontology Primary Care Nurse Practitioner [facility] 06/20/2018, 9:17 AM

## 2018-06-20 NOTE — Patient Instructions (Addendum)
Melvin Ramos,   Thank you for coming in to clinic today.  Shingles vaccine requires a repeat dose 2-6 months after first dose.  This is only series you will need for life.   - This is covered better by commercial insurance rather than Medicare.  We can give from this clinic when you are ready to get it.  Please schedule a follow-up appointment with Cassell Smiles, AGNP. Return in about 1 year (around 06/21/2019) for annual physical, COPD.  If you have any other questions or concerns, please feel free to call the clinic or send a message through [place]. You may also schedule an earlier appointment if necessary.  You will receive a survey after today's visit either digitally by e-mail or paper by [employer]. Your experiences and feedback matter to [place].  Please respond so we know how we are doing as we provide care for you.   Cassell Smiles, DNP, AGNP-BC Adult Gerontology Nurse Practitioner [facility], [facility]   N?i soi ??i trng, Ng??i l?n Colonoscopy, Adult N?i soi ??i trng l khm ?? ki?m tra ton b? ru?t gi. Trong qu trnh th?m khm, m?t ?ng m?m ???c bi tr?n c g?n camera ? ??u ???c ??a vo trong h?u mn v sau ? vo tr?c trng, ??i trng v cc ph?n khc c?a ru?t gi. Qu v? c th? ???c n?i soi ??i trng nh? m?t ph?n c?a sng l?c ??i tr?c trng thng th??ng ho?c n?u qu v? c cc tri?u ch?ng nh?t ??nh, ch?ng h?n nh?:  Thi?u h?ng c?u (thi?u mu).  Tiu ch?y khng h?t.  ?au b?ng.  Mu trong phn (phn). N?i soi ??i trng c th? gip sng l?c v ch?n ?on cc v?n ?? b?nh l, bao g?m:  Kh?i u.  Polip.  Vim.  Cc vng ch?y mu. Hy cho chuyn gia ch?m [place] s?c kh?e bi?t v?:  B?t k? v?n ?? d? ?ng no m qu v? c.  T?t c? cc lo?i thu?c m qu v? ?ang s? d?ng, bao g?m c? vitamin, th?o d??c, thu?c nh? m?t, thu?c d?ng kem v thu?c khng k ??n.  B?t k? v?n ?? g m qu v? ho?c cc thnh vin trong gia ?nh ? g?p ph?i v?i thu?c gy m.  B?t k? b?nh l v? mu  no m qu v? c.  B?t k? ph?u thu?t no qu v? ? c.  B?t k? tnh tr?ng b?nh l no qu v? c.  B?t k? v?n ?? no m qu v? ? b? trong khi ??i ti?n. C nh?ng nguy c? no? Ni chung, ?y l m?t th? thu?t an ton. Tuy nhin, cc v?n ?? c th? x?y ra, bao g?m:  Ch?y mu.  V?t rch trong ru?t non.  Ph?n ?ng v?i thu?c ???c cho dng trong lc khm.  Nhi?m trng (hi?m g?p). ?i?u g x?y ra tr??c khi lm th? thu?t? Nh?ng h?n ch? v? ?n v u?ng Tun th? ch? d?n c?a chuyn gia ch?m [place] s?c kh?e v? ?n v u?ng, c th? bao g?m:  M?t vi ngy tr??c khi ti?n hnh th? thu?t - tun theo ch? ?? ?n t ch?t x?. Trnh ?n qu? h?ch, cc lo?i h?t, tri cy s?y, tri cy s?ng v rau.  1-3 ngy tr??c ngy ti?n hnh th? thu?t - tun theo ch? ?? ?n ?? l?ng trong. Ch? u?ng cc ?? l?ng trong, ch?ng h?n nh? canh ho?c n??c canh th?t trong, tr ho?c c ph ?en, n??c p trong, n??c ng?t ho?c n??c u?ng  th? thao trong, mn trng mi?ng ch?a gelatin v kem que. Trnh u?ng b?t c? ?? l?ng no c ph?m mu ?? ho?c mu tm.  Vo ngy ti?n hnh th? thu?t - khng ?n hay u?ng b?t k? th? g b?t ??u t? 2 gi? tr??c khi ti?n hnh th? thu?t, ho?c trong kho?ng th?i gian m chuyn gia ch?m [place] s?c kh?e c?a qu v? khuy?n ngh?. T?i ?a 2 ti?ng tr??c 32 lm th? thu?t - qu v? c th? ti?p t?c u?ng ?? l?ng trong, ch?ng h?n nh? n??c, n??c p tri cy trong. Lm s?ch ru?t N?u qu v? ? ???c k ??n m?t lo?i thu?c x? qua ???ng u?ng ?? lm s?ch ??i trng:  Hy s?? du?ng theo ch? d?n c?a chuyn gia ch?m [place] s?c kh?e c?a qu v?. B?t ??u vo ngy tr??c khi lm th? thu?t, qu v? s? c?n u?ng m?t l??ng l?n d?ch l?ng pha thu?c. D?ch l?ng ny s? lm qu v? ??i ti?n phn l?ng nhi?u l?n cho ??n khi phn g?n nh? trong ho?c c mu xanh l cy nh?t.  N?u da ho?c h?u mn c?a qu v? b? kch thch do tiu ch?y, qu v? c th? s? d?ng nh?ng th? sau ?? lm gi?m kch thch: ? Kh?n lau t?m thu?c, ch?ng h?n nh? kh?n lau ??t c?a ng??i l?n c l h?i v vitamin  E. ? S?n ph?m lm d?u da nh? vas[place].  N?u qu v? b? nn trong khi u?ng thu?c x?, hy ngh? ng?i trong t?i ?a 60 pht v sau ? b?t ??u vi?c lm s?ch ru?t m?t l?n n?a. N?u qu v? ti?p t?c nn v khng th? u?ng thu?c x? m khng b? nn, hy g?i cho chuyn gia ch?m [place] s?c kh?e c?a qu v?.  ?? lm s?ch ??i trng, qu v? c?ng c th? ???c cho dng: ? Thu?c nhu?n trng. ? H??ng d?n cch s? d?ng th?t. H??ng d?n chung  Hy h?i chuyn gia ch?m [place] s?c kh?e v? vi?c: ? Thay ??i ho?c d?ng s? d?ng cc lo?i thu?c ho?c cc th?c ph?m ch?c n?ng b? sung qu v? th??ng dng. ?i?u ny ??c bi?t quan tr?ng n?u qu v? ?ang dng cc th?c ph?m ch?c n?ng b? sung s?t, thu?c ?i?u tr? ti?u ???ng ho?c thu?c lm long mu. ? ?ang dng cc lo?i thu?c nh? aspirin v ibuprofen. Nh?ng thu?c ny c th? lm long mu. Khng dng nh?ng lo?i thu?c ny tr??c khi lm th? thu?t n?u chuyn gia ch?m [place] s?c kh?e khuyn qu v? khng dng.  C k? ho?ch nh? ai ? ??a quy? vi? t? b?nh vi?n ho?c t? phng khm v? nh. ?i?u g x?y ra trong qu trnh th?c hi?n th? thu?t?   Qu v? c th? ???c ??t m?t ???ng truy?n t?nh m?ch (IV) vo m?t trong cc t?nh m?ch.  Qu v? s? ???c cho dng thu?c ?? gip th? gin (thu?c an th?n).  ?? gi?m nguy c? nhi?m trng: ? ??i ng? nhn vin y t? s? r?a ho?c st trng tay c?a h?. ? Vng h?u mn c?a qu v? s? ???c r?a b?ng x phng.  Qu v? s? ???c yu c?u n?m nghing, hai ??u g?i g?p l?i.  Chuyn gia ch?m [place] s?c kh?e c?a qu v? s? bi tr?n m?t ?ng di, m?ng, m?m. ?ng s? ???c g?n camera v ?n ? ??u.  ?ng s? ???c ??a vo h?u mn c?a qu v?.  ?ng s? ???c nh? nhng ??a qua tr?c trng v ??i trng c?a qu  v?.  Khng kh s? ???c b?m vo ??i trng c?a qu v? ?? gi? cho ??i trng m? r?ng. Qu v? c th? c?m th?y m?t cht p l?c ho?c co th?t.  Camera s? ???c s? d?ng ?? ch?p ?nh trong qu trnh ti?n hnh th? thu?t.  M?t m?u m nh? co? th? ????c l?y ?? ki?m tra d???i ki?nh hi?n vi (sinh thi?t).  N?u pht  hi?n th?y cc polip nh?, chuyn gia ch?m [place] s?c kh?e c?a qu v? c th? l?y cc polip ? v ki?m tra ?? xem c t? bo ung th? khng.  Khi khm xong, ?ng s? ???c tho ra. Th? thu?t ny c th? khc nhau gi?a cc chuyn gia ch?m [place] s?c kh?e v cc b?nh vi?n. ?i?u g x?y ra sau khi lm th? thu?t?  Huy?t p, nh?p tim, nh?p th? v n?ng ??  xi trong mu c?a qu v? s? ???c theo di cho ??n khi thu?c qu v? ? dng h?t tc d?ng.  Khng li xe trong vng 24 gi? sau khi khm.  Qu v? c th? c m?t l??ng mu nh? trong phn.  Qu v? c th? trung ti?n v b? co th?t ho?c ch??ng b?ng nh? do khng kh ? ???c s? d?ng ?? lm ph?ng ??i trng c?a qu v? trong lc khm.  Qu v? ???c ty  l?y k?t qu? th? thu?t c?a mnh. Hy h?i chuyn gia ch?m [place] s?c kh?e ho?c khoa th?c hi?n thu? thu?t ?? bi?t khi no c k?t qu? c?a qu v?. Tm t?t  N?i soi ??i trng l khm ?? ki?m tra ton b? ru?t gi.  Trong qu trnh n?i soi ??i trng, m?t ?ng m?m ???c bi tr?n c g?n camera ? ??u ???c ??a vo trong h?u mn v sau ? vo ??i trng v cc ph?n khc c?a ru?t gi.  Tun th? ch? d?n c?a chuyn gia ch?m [place] s?c kh?e v? ?n v u?ng tr??c khi lm th? thu?t.  N?u qu v? ? ???c k m?t lo?i thu?c x? qua ???ng u?ng ?? lm s?ch ??i trng, u?ng thu?c theo ch? d?n c?a chuyn gia ch?m [place] s?c kh?e.  Sau khi lm th? thu?t, huy?t p, nh?p tim, nh?p th? v n?ng ??  xi trong mu c?a qu v? s? ???c theo di cho ??n khi thu?c qu v? ? ???c cho dng h?t tc d?ng. Thng tin ny khng nh?m m?c ?ch thay th? cho l?i khuyn m chuyn gia ch?m [place] s?c kh?e ni v?i qu v?. Hy b?o ??m qu v? ph?i th?o lu?n b?t k? v?n ?? g m qu v? c v?i chuyn gia ch?m  s?c kh?e c?a qu v?. Document Released: 03/03/2005 Document Revised: 05/09/2017 Document Reviewed: 08/05/2015 Elsevier Interactive Patient Education  2019 Elsevier Inc.    Recombinant Zoster (Shingles) Vaccine, RZV: What You Need to Know 1. Why get vaccinated? Shingles (also called  herpes zoster, or just zoster) is a painful skin rash, often with blisters. Shingles is caused by the varicella zoster virus, the same virus that causes chickenpox. After you have chickenpox, the virus stays in your body and can cause shingles later in life. You can't catch shingles from another person. However, a person who has never had chickenpox (or chickenpox vaccine) could get chickenpox from someone with shingles. A shingles rash usually appears on one side of the face or body and heals within 2 to 4 weeks. Its main symptom is pain, which can be severe. Other symptoms can include fever,  headache, chills, and upset stomach. Very rarely, a shingles infection can lead to pneumonia, hearing problems, blindness, brain inflammation (encephalitis), or death. For about 1 person in 5, severe pain can continue even long after the rash has cleared up. This long-lasting pain is called post-herpetic neuralgia (PHN). Shingles is far more common in people 40 years of age and older than in younger people, and the risk increases with age. It is also more common in people whose immune system is weakened because of a disease such as cancer, or by drugs such as steroids or chemotherapy. At least 1 million people a year in the [place] get shingles. 2. Shingles vaccine (recombinant) Recombinant shingles vaccine was approved by FDA in 2017 for the prevention of shingles. In clinical trials, it was more than 90% effective in preventing shingles. It can also reduce the likelihood of PHN. Two doses, 2 to 6 months apart, are recommended for adults 66 and older. This vaccine is also recommended for people who have already gotten the live shingles vaccine (Zostavax). There is no live virus in this vaccine. 3. Some people should not get this vaccine Tell your vaccine provider if you:  Have any severe, life-threatening allergies. A person who has ever had a life-threatening allergic reaction after a dose of recombinant  shingles vaccine, or has a severe allergy to any component of this vaccine, may be advised not to be vaccinated. Ask your health care provider if you want information about vaccine components.  Are pregnant or breastfeeding. There is not much information about use of recombinant shingles vaccine in pregnant or nursing women. Your healthcare provider might recommend delaying vaccination.  Are not feeling well. If you have a mild illness, such as a cold, you can probably get the vaccine today. If you are moderately or severely ill, you should probably wait until you recover. Your doctor can advise you. 4. Risks of a vaccine reaction With any medicine, including vaccines, there is a chance of reactions. After recombinant shingles vaccination, a person might experience:  Pain, redness, soreness, or swelling at the site of the injection  Headache, muscle aches, fever, shivering, fatigue In clinical trials, most people got a sore arm with mild or moderate pain after vaccination, and some also had redness and swelling where they got the shot. Some people felt tired, had muscle pain, a headache, shivering, fever, stomach pain, or nausea. About 1 out of 6 people who got recombinant zoster vaccine experienced side effects that prevented them from doing regular activities. Symptoms went away on their own in about 2 to 3 days. Side effects were more common in younger people. You should still get the second dose of recombinant zoster vaccine even if you had one of these reactions after the first dose. Other things that could happen after this vaccine:  People sometimes faint after medical procedures, including vaccination. Sitting or lying down for about 15 minutes can help prevent fainting and injuries caused by a fall. Tell your provider if you feel dizzy or have vision changes or ringing in the ears.  Some people get shoulder pain that can be more severe and longer-lasting than routine soreness that can  follow injections. This happens very rarely.  Any medication can cause a severe allergic reaction. Such reactions to a vaccine are estimated at about 1 in a million doses, and would happen within a few minutes to a few hours after the vaccination. As with any medicine, there is a very remote chance of a  vaccine causing a serious injury or death. The safety of vaccines is always being monitored. For more information, visit: http://www.aguilar.org/ 5. What if there is a serious problem? What should I look for?  Look for anything that concerns you, such as signs of a severe allergic reaction, very high fever, or unusual behavior. Signs of a severe allergic reaction can include hives, swelling of the face and throat, difficulty breathing, a fast heartbeat, dizziness, and weakness. These would usually start a few minutes to a few hours after the vaccination. What should I do?  If you think it is a severe allergic reaction or other emergency that can't wait, call 9-1-1 or get to the nearest hospital. Otherwise, call your health care provider. Afterward, the reaction should be reported to the Vaccine Adverse Event Reporting System (VAERS). Your doctor should file this report, or you can do it yourself through the VAERS website at www.vaers.SamedayNews.es, or by calling 743 483 2426. VAERS does not give medical advice. 6. How can I learn more?  Ask your health care provider. He or she can give you the vaccine package insert or suggest other sources of information.  Call your local or state health department.  Contact the Centers for Disease Control and Prevention (CDC): ? Call (862)705-6814 (1-800-CDC-INFO) or ? Visit CDC's vaccines website at http://hunter.com/ CDC Vaccine Information Statement Recombinant Zoster Vaccine (07/19/2016) This information is not intended to replace advice given to you by your health care provider. Make sure you discuss any questions you have with your health care  provider. Document Released: 08/03/2016 Document Revised: 12/28/2017 Document Reviewed: 12/28/2017 Elsevier Interactive Patient Education  2019 [employer].

## 2018-06-21 ENCOUNTER — Other Ambulatory Visit: Payer: Self-pay

## 2018-06-21 DIAGNOSIS — Z1211 Encounter for screening for malignant neoplasm of colon: Secondary | ICD-10-CM

## 2018-06-21 LAB — COMPLETE METABOLIC PANEL WITH GFR
AG Ratio: 1.6 (calc) (ref 1.0–2.5)
ALT: 13 U/L (ref 9–46)
AST: 18 U/L (ref 10–35)
Albumin: 4.4 g/dL (ref 3.6–5.1)
Alkaline phosphatase (APISO): 48 U/L (ref 40–115)
BUN: 20 mg/dL (ref 7–25)
CO2: 25 mmol/L (ref 20–32)
Calcium: 9.9 mg/dL (ref 8.6–10.3)
Chloride: 104 mmol/L (ref 98–110)
Creat: 1.11 mg/dL (ref 0.70–1.33)
GFR, Est African American: 87 mL/min/{1.73_m2} (ref >=60)
GFR, Est Non African American: 75 mL/min/{1.73_m2} (ref >=60)
Globulin: 2.8 g/dL (calc) (ref 1.9–3.7)
Glucose, Bld: 72 mg/dL (ref 65–99)
Potassium: 4.9 mmol/L (ref 3.5–5.3)
Sodium: 138 mmol/L (ref 135–146)
Total Bilirubin: 0.4 mg/dL (ref 0.2–1.2)
Total Protein: 7.2 g/dL (ref 6.1–8.1)

## 2018-06-21 LAB — CBC WITH DIFFERENTIAL/PLATELET
Absolute Monocytes: 420 cells/uL (ref 200–950)
Basophils Absolute: 30 cells/uL (ref 0–200)
Basophils Relative: 0.5 %
Eosinophils Absolute: 156 cells/uL (ref 15–500)
Eosinophils Relative: 2.6 %
HCT: 47.3 % (ref 38.5–50.0)
Hemoglobin: 16.3 g/dL (ref 13.2–17.1)
Lymphs Abs: 2622 cells/uL (ref 850–3900)
MCH: 32.5 pg (ref 27.0–33.0)
MCHC: 34.5 g/dL (ref 32.0–36.0)
MCV: 94.4 fL (ref 80.0–100.0)
MPV: 11.5 fL (ref 7.5–12.5)
Monocytes Relative: 7 %
Neutro Abs: 2772 cells/uL (ref 1500–7800)
Neutrophils Relative %: 46.2 %
Platelets: 221 10*3/uL (ref 140–400)
RBC: 5.01 10*6/uL (ref 4.20–5.80)
RDW: 13.1 % (ref 11.0–15.0)
Total Lymphocyte: 43.7 %
WBC: 6 10*3/uL (ref 3.8–10.8)

## 2018-06-21 LAB — LIPID PANEL
Cholesterol: 169 mg/dL (ref <200)
HDL: 41 mg/dL (ref >40)
LDL Cholesterol (Calc): 103 mg/dL (calc) — ABNORMAL HIGH
Non-HDL Cholesterol (Calc): 128 mg/dL (calc) (ref <130)
Total CHOL/HDL Ratio: 4.1 (calc) (ref <5.0)
Triglycerides: 152 mg/dL — ABNORMAL HIGH (ref <150)

## 2018-06-21 LAB — HEMOGLOBIN A1C
Hgb A1c MFr Bld: 5.3 % of total Hgb (ref <5.7)
Mean Plasma Glucose: 105 (calc)
eAG (mmol/L): 5.8 (calc)

## 2018-06-21 LAB — HEPATITIS C ANTIBODY
Hepatitis C Ab: NONREACTIVE
SIGNAL TO CUT-OFF: 0.03 (ref <1.00)

## 2018-06-21 LAB — HIV ANTIBODY (ROUTINE TESTING W REFLEX): HIV 1&2 Ab, 4th Generation: NONREACTIVE

## 2018-06-21 LAB — PSA: PSA: 0.5 ng/mL (ref <=4.0)

## 2018-06-21 LAB — TSH: TSH: 1.24 mIU/L (ref 0.40–4.50)

## 2018-07-10 DIAGNOSIS — J449 Chronic obstructive pulmonary disease, unspecified: Secondary | ICD-10-CM | POA: Diagnosis not present

## 2018-07-10 DIAGNOSIS — H7192 Unspecified cholesteatoma, left ear: Secondary | ICD-10-CM | POA: Diagnosis not present

## 2018-08-11 DIAGNOSIS — H7192 Unspecified cholesteatoma, left ear: Secondary | ICD-10-CM | POA: Diagnosis not present

## 2018-08-11 DIAGNOSIS — H7292 Unspecified perforation of tympanic membrane, left ear: Secondary | ICD-10-CM | POA: Diagnosis not present

## 2018-08-11 DIAGNOSIS — F17200 Nicotine dependence, unspecified, uncomplicated: Secondary | ICD-10-CM | POA: Diagnosis not present

## 2018-08-11 DIAGNOSIS — J449 Chronic obstructive pulmonary disease, unspecified: Secondary | ICD-10-CM | POA: Diagnosis not present

## 2018-08-11 HISTORY — PX: TYMPANOSTOMY: SHX2586

## 2018-08-28 ENCOUNTER — Telehealth: Payer: Self-pay

## 2018-08-28 NOTE — Telephone Encounter (Signed)
Patients daughter has been informed that her fathers colonoscopy has been canceled for 04/08 due to COVID 19 and we will call back to reschedule once cleared.  Thanks [employer]

## 2018-08-29 ENCOUNTER — Other Ambulatory Visit: Payer: Self-pay

## 2018-08-29 ENCOUNTER — Encounter: Payer: Self-pay | Admitting: Nurse Practitioner

## 2018-08-29 ENCOUNTER — Telehealth (INDEPENDENT_AMBULATORY_CARE_PROVIDER_SITE_OTHER): Payer: Commercial Managed Care - PPO | Admitting: Nurse Practitioner

## 2018-08-29 DIAGNOSIS — J410 Simple chronic bronchitis: Secondary | ICD-10-CM

## 2018-08-29 DIAGNOSIS — Z716 Tobacco abuse counseling: Secondary | ICD-10-CM

## 2018-08-29 DIAGNOSIS — H7292 Unspecified perforation of tympanic membrane, left ear: Secondary | ICD-10-CM | POA: Diagnosis not present

## 2018-08-29 DIAGNOSIS — F17210 Nicotine dependence, cigarettes, uncomplicated: Secondary | ICD-10-CM

## 2018-08-29 MED ORDER — TIOTROPIUM BROMIDE MONOHYDRATE 18 MCG IN CAPS: 18.0000 ug | ORAL_CAPSULE | Freq: Every day | RESPIRATORY_TRACT | 12 refills | Status: DC

## 2018-08-29 NOTE — Progress Notes (Addendum)
Subjective:    Patient ID: Melvin Ramos, male    DOB: 04-01-1966, 53 y.o.   MRN: [id number]  Melvin Ramos is a 53 y.o. male presenting on 08/29/2018 for Smoking Cessation  [facility] is used via phone call.  HPI Smoking Cessation Patient is currently smoking 1.5 ppd.   Patient has not tried anything to quit.   - Patient is not sure if he is having cravings when trying to cut back because he has not tried to cut down on the amount (per patient report).  - In future, patient would prefer to try medication to stop in future if not able to cut down.  When given option of nicotine replacement vs Chantix/Wellbutrin, patient's nephew Melvin Ramos mentions that pills would be best since insurance will cover these. Melvin Ramos also notes he only brought home 2 packs of cigarettes due to financial situation this week and patient has already started cutting back to make the cigarettes last.  COPD Patient is currently prescribed Spiriva once daily and albuterol as rescue inhaler.  He is not taking Spiriva any longer.  Cost is problem for Spiriva.  Patient's nephew suggests that if less expensive option available, they need to change.  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  - Albuterol is taking when he starts coughing. He uses this about 2-3 times per week.    Chronic TM rupture: Continues having ear drainage. Patient did not see his ENT specialist.  Follow-up will be this week.  Social History   Tobacco Use  . Smoking status: Current Every Day Smoker    Packs/day: 1.50    Years: 30.00    Pack years: 45.00    Types: Cigarettes  . Smokeless tobacco: Never Used  Substance Use Topics  . Alcohol use: Yes    Comment: "sometimes"  . Drug use: No    Review of Systems Per HPI unless specifically indicated above     Objective:    There were no vitals taken for this visit.  Wt Readings from Last 3 Encounters:  06/20/18 127 lb 9.6  oz (57.9 kg)  03/15/18 129 lb 6.4 oz (58.7 kg)  03/23/17 130 lb 9.6 oz (59.2 kg)    Physical Exam  - No hands on physical exam. Patient remotely monitored.  Verbal communication appropriate.  Patient is appropriately communicative with me through the interpreter.     Assessment & Plan:   Problem List Items Addressed This Visit      Respiratory   Simple chronic bronchitis ([place]) - Primary Stable today on HPI from phone encounter despite not taking Spiriva.  Is using rescue inhaler minimally.  Medications tolerated without side effects.  Continue with advice given to use online Spiriva copay card at pharmacy.  Prefer daily use inhaler.  Limit use of albuterol only for shortness of breath and increasing respiratory symptoms.  Refills provided.  Followup 3 months.      Nervous and Auditory   Ruptured tympanic membrane, left Patient is still connected with ENT, but has not had follow-up visit.  Due this week.  No treatment needed at this time here.     Other   Smoking greater than 30 pack years Smoking Cessation Encounter Patient is interested in and ready to quit smoking.  He has not tried to cut back to date, but Melvin Ramos states he may have started already this week due to decreased availability of funds to purchase cigarettes.  Patient  may need pharmacotherapy in future and would prefer Wellbutrin or Chantix. Further discussion at that time. - Encouraged patient to start cutting back to 1 1/4 ppd this week, then 1 ppd next week and continue.  - Use distraction or other behaviors (walking, deep breathing, occupying hands or mouth) to help if cravings begin. - Shared 1-800 quit now information.  Family may be able to help interpret.  Also some available resources online. - Follow-up 4 weeks for update on progress.  Consider pharmacotherapy at that time.       Discussion today >5 minutes (<10 minutes) specifically on counseling on risks of tobacco use, complications, treatment, smoking  cessation options, community resources as above and in AVS.   Follow up plan: Follow-up 4 weeks  Disclosed to patient at start of encounter that we will bill telephone services and he will receive bill of services provided.  Patient consents to be treated via phone prior to discussion. - Patient is at his home and is accessed via telephone. - [facility] are provided from [facility]. - Time spent in direct consultation with patient on phone: 19 minutes   Cassell Smiles, DNP, AGPCNP-BC Adult Gerontology Primary Care Nurse Practitioner [facility] 08/29/2018, 8:29 AM

## 2018-08-29 NOTE — Patient Instructions (Signed)
Melvin Ramos,   Thank you for coming in to clinic today.  1. Start reducing cigarettes to 1 and 1/4 ppd (25 cigarettes) per day for 1 week.   - Continue to reduce to 1 ppd for one - two weeks.   2. After 4 weeks, will consider Chantix or Wellbutrin if not successful with cutting back independently.   3. Provided 1-800 Quit now info   Please schedule a follow-up appointment with Cassell Smiles, AGNP.  Follow-up 4 weeks if needed.  If you have any other questions or concerns, please feel free to call the clinic or send a message through [place]. You may also schedule an earlier appointment if necessary.  You will receive a survey after today's visit either digitally by e-mail or paper by [employer]. Your experiences and feedback matter to [place].  Please respond so we know how we are doing as we provide care for you.   Cassell Smiles, DNP, AGNP-BC Adult Gerontology Nurse Practitioner [place]

## 2018-08-29 NOTE — Progress Notes (Signed)
Cc: smoking cessation. Pt currently smoking about 1.5 packs daily.

## 2018-08-31 ENCOUNTER — Other Ambulatory Visit: Payer: Self-pay

## 2018-09-11 ENCOUNTER — Ambulatory Visit: Payer: Commercial Managed Care - PPO | Admitting: Nurse Practitioner

## 2018-09-13 ENCOUNTER — Ambulatory Visit: Admit: 2018-09-13 | Payer: Self-pay | Admitting: Gastroenterology

## 2018-09-13 SURGERY — COLONOSCOPY WITH PROPOFOL
Anesthesia: Choice

## 2019-01-19 ENCOUNTER — Other Ambulatory Visit: Payer: Self-pay

## 2019-01-19 ENCOUNTER — Ambulatory Visit (INDEPENDENT_AMBULATORY_CARE_PROVIDER_SITE_OTHER): Payer: Commercial Managed Care - PPO | Admitting: Nurse Practitioner

## 2019-01-19 DIAGNOSIS — S46011A Strain of muscle(s) and tendon(s) of the rotator cuff of right shoulder, initial encounter: Secondary | ICD-10-CM

## 2019-01-19 DIAGNOSIS — M7661 Achilles tendinitis, right leg: Secondary | ICD-10-CM | POA: Diagnosis not present

## 2019-01-19 MED ORDER — DICLOFENAC SODIUM 75 MG PO TBEC: 75.0000 mg | DELAYED_RELEASE_TABLET | Freq: Two times a day (BID) | ORAL | 0 refills | Status: DC

## 2019-01-19 NOTE — Progress Notes (Signed)
Telemedicine Encounter: Disclosed to patient at start of encounter that we will provide appropriate telemedicine services.  Patient consents to be treated via phone prior to discussion. - Patient is at his home and is accessed via telephone. - Services are provided by Cassell Smiles from [facility].  Subjective:    Patient ID: Melvin Ramos, male    DOB: 1965/06/27, 53 y.o.   MRN: [id number]  Melvin Ramos is a 53 y.o. male presenting on 01/19/2019 for Leg Pain (Right ankle pain that worsen with walking ) and Shoulder Pain (Right shoulder pain )  HPI Interpreter: Lucianne Lei ID: [id number]  Right ankle pain Right ankle pain that worsens with walking.  Pain started approximately about 2 days ago.  No known injury.  Patient is only working and standing.  Notes pain as a spasm.  Pain is located in the posterior medial ankle where the ankle "has a little hole and that area is very painful.".  Pain also near where you can feel achilles tendon on the back of the foot.  Patient can place the foot flat on the floor, but has pain when standing. Pain does not occurs when first standing and worsens after prolonged standing.   Pain has never happened before.  Right shoulder pain Near shoulder blade.  This pain started at same time as ankle.  Patient does note having lifted anything heavy before the pain started.  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  No sharp pain when lifting, but pain gradually worsened. - Patient can raise arm overhead, but is quite painful.  When he turns arm to back and up front has pain. Patient has to bend arm to touch back or front. Empty can test: causes pain - Full can test: causes pain With more pain thumb up.  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   - Patient has not filed worker's compensation form.   Social History   Tobacco Use  . Smoking status: Current  Every Day Smoker    Packs/day: 1.50    Years: 30.00    Pack years: 45.00    Types: Cigarettes  . Smokeless tobacco: Never Used  Substance Use Topics  . Alcohol use: Yes    Comment: "sometimes"  . Drug use: No    Review of Systems Per HPI unless specifically indicated above     Objective:    There were no vitals taken for this visit.  Wt Readings from Last 3 Encounters:  06/20/18 127 lb 9.6 oz (57.9 kg)  03/15/18 129 lb 6.4 oz (58.7 kg)  03/23/17 130 lb 9.6 oz (59.2 kg)    Physical Exam Patient remotely monitored.  Verbal communication appropriate.  Cognition normal.   Results for orders placed or performed in visit on 06/20/18  Lipid panel  Result Value Ref Range   Cholesterol 169 <200 mg/dL   HDL 41 >40 mg/dL   Triglycerides 152 (H) <150 mg/dL   LDL Cholesterol (Calc) 103 (H) mg/dL (calc)   Total CHOL/HDL Ratio 4.1 <5.0 (calc)   Non-HDL Cholesterol (Calc) 128 <130 mg/dL (calc)  TSH  Result Value Ref Range   TSH 1.24 0.40 - 4.50 mIU/L  PSA  Result Value Ref Range   PSA 0.5 < OR = 4.0 ng/mL  Hemoglobin A1c  Result Value Ref Range   Hgb A1c MFr Bld 5.3 <5.7 %  of total Hgb   Mean Plasma Glucose 105 (calc)   eAG (mmol/L) 5.8 (calc)  Hepatitis C antibody  Result Value Ref Range   Hepatitis C Ab NON-REACTIVE NON-REACTI   SIGNAL TO CUT-OFF 0.03 <1.00  COMPLETE METABOLIC PANEL WITH GFR  Result Value Ref Range   Glucose, Bld 72 65 - 99 mg/dL   BUN 20 7 - 25 mg/dL   Creat 1.11 0.70 - 1.33 mg/dL   GFR, Est Non African American 75 > OR = 60 mL/min/1.87m2   GFR, Est African American 87 > OR = 60 mL/min/1.105m2   BUN/Creatinine Ratio NOT APPLICABLE 6 - 22 (calc)   Sodium 138 135 - 146 mmol/L   Potassium 4.9 3.5 - 5.3 mmol/L   Chloride 104 98 - 110 mmol/L   CO2 25 20 - 32 mmol/L   Calcium 9.9 8.6 - 10.3 mg/dL   Total Protein 7.2 6.1 - 8.1 g/dL   Albumin 4.4 3.6 - 5.1 g/dL   Globulin 2.8 1.9 - 3.7 g/dL (calc)   AG Ratio 1.6 1.0 - 2.5 (calc)   Total Bilirubin 0.4 0.2  - 1.2 mg/dL   Alkaline phosphatase (APISO) 48 40 - 115 U/L   AST 18 10 - 35 U/L   ALT 13 9 - 46 U/L  CBC with Differential/Platelet  Result Value Ref Range   WBC 6.0 3.8 - 10.8 Thousand/uL   RBC 5.01 4.20 - 5.80 Million/uL   Hemoglobin 16.3 13.2 - 17.1 g/dL   HCT 47.3 38.5 - 50.0 %   MCV 94.4 80.0 - 100.0 fL   MCH 32.5 27.0 - 33.0 pg   MCHC 34.5 32.0 - 36.0 g/dL   RDW 13.1 11.0 - 15.0 %   Platelets 221 140 - 400 Thousand/uL   MPV 11.5 7.5 - 12.5 fL   Neutro Abs 2,772 1,500 - 7,800 cells/uL   Lymphs Abs 2,622 850 - 3,900 cells/uL   Absolute Monocytes 420 200 - 950 cells/uL   Eosinophils Absolute 156 15 - 500 cells/uL   Basophils Absolute 30 0 - 200 cells/uL   Neutrophils Relative % 46.2 %   Total Lymphocyte 43.7 %   Monocytes Relative 7.0 %   Eosinophils Relative 2.6 %   Basophils Relative 0.5 %  HIV Antibody (routine testing w rflx)  Result Value Ref Range   HIV 1&2 Ab, 4th Generation NON-REACTIVE NON-REACTI      Assessment & Plan:   Problem List Items Addressed This Visit    None    Visit Diagnoses    Strain of musc/tend the rotator cuff of right shoulder, init    -  Primary   Relevant Medications   diclofenac (VOLTAREN) 75 MG EC tablet   Other Relevant Orders   Ambulatory referral to Orthopedic Surgery   Achilles tendinitis of right lower extremity         Patient with injuries to right shoulder and achilles tendon when at work.  Likely caused by and exacerbated by repetitive movements working at a loom.  Inadequate conservative therapy to date.  Plan: 1. Encourage patient to do light duty work only at this time.  2. START diclofenac 75mg  bid x 14 days, then prn. 3. Referral orthopedics for additional evaluation.  May need rotator cuff repair given patient's description of ROM limitations. 4. Follow-up 2-4 weeks if no improvement.  May also need podiatry referral, but should start with management of both conditions at orthopedics first.   Meds ordered this  encounter  Medications  .  diclofenac (VOLTAREN) 75 MG EC tablet    Sig: Take 1 tablet (75 mg total) by mouth 2 (two) times daily.    Dispense:  30 tablet    Refill:  0    Order Specific Question:   Supervising Provider    Answer:   Olin Hauser [[id number]]   - Time spent in direct consultation with patient via telemedicine about above concerns: 25 minutes  Follow up plan: Return if symptoms worsen or fail to improve.  Cassell Smiles, DNP, AGPCNP-BC Adult Gerontology Primary Care Nurse Practitioner [facility] 01/19/2019, 8:29 AM

## 2019-01-19 NOTE — Patient Instructions (Addendum)
Vim gn [place] Achilles Tendinitis  Vim gn [place] l tnh tr?ng vim d?i gn c?ng gi?ng nh? dy th?ng n?i cc c? ? b?p chn v?i x??ng gt chn (gn [place]). B?nh ny th??ng do l?m d?ng gn v c? chn gy ra. Vim gn [place] th??ng ?? h?n theo th?i gian khi c ?i?u tr? v t? ch?m [place] t?i nh. C th? m?t vi tu?n ho?c vi thng ?? kh?i h?n. Nguyn nhn g gy ra? Tnh tr?ng ny c th? do:  ??t ng?t t?ng c??ng ?? t?p luy?n ho?c ho?t ??ng, ch?ng h?n nh? ch?y.  T?p cc bi t?p ho?c th?c hi?n cc ho?t ??ng (ch?ng h?n nh? nh?y) gi?ng nhau l?p ?i l?p l?i.  Khng kh?i ??ng cc c? ? b?p chn tr??c khi t?p.  T?p th? d?c b?ng giy b? rch ho?c lo?i giy khng ph?i dng ?? t?p th? d?c.  B? vim kh?p ho?c u x??ng (gai x??ng) ? m?t sau c?a x??ng gt chn. Ph?n ny c th? t? vo gn v lm gn b? t?n th??ng.  Mo?n va? ra?ch lin quan ??n tu?i ta?c. Cc gn tr? nn km linh ho?t theo tu?i tc v d? b? t?n th??ng h?n. Cc d?u hi?u ho?c tri?u ch?ng l g? Nh?ng tri?u ch?ng th??ng g?p c?a tnh tr?ng ny bao g?m:  ?au ? gn [place] ho?c ? m?t sau c?a chn, ngay trn gt chn. ?au th??ng tr?m tr?ng h?n khi t?p luy?n.  C?ng ho?c ?au nh?c ? m?t sau c?a chn, ??c bi?t l vo bu?i sng.  S?ng ? da pha trn gn [place].  Gn dy ln.  Gai x??ng ? pha d??i gn [place], g?n gt chn.  Kh ??ng trn ??u ngn chn. Ch?n ?on tnh tr?ng ny nh? th? no? B?nh ny ???c ch?n ?on d?a vo cc tri?u ch?ng c?a qu v? v khm th?c th?Sander Nephew v? c th? ???c lm cc ki?m tra, bao g?m:  Ch?p X quang.  Ch?p MRI. Tnh tr?ng ny ???c ?i?u tr? nh? th? no? M?c tiu ?i?u tr? l gi?m nh? tri?u ch?ng v gip lnh t?n th??ng. ?i?u tr? c th? bao g?m:  Gi?m ho?c ng?ng cc ho?t ??ng gy vim gn. ?i?u ny c th? ngh?a l chuy?n sang cc bi t?p c tc ??ng th?p nh? ??p xe ho?c b?i.  Ch??m ? l?nh cho khu v?c b? t?n th??ng.  Lm v?t l tr? li?u, bao g?m cc bi t?p t?ng c??ng s?c m?nh v gin c?.  Cc thu?c ch?ng  vim khng steroid (NSAID) gip gi?m ?au v gi?m s?ng.  Dng giy nng ??, b?ng qu?n, mi?ng lt nng gt chn, ?ng ?i b? (b?ng b?t kh).  Ph?u thu?t. Ph?u thu?t c th? ???c th?c hi?n n?u cc tri?u ch?ng c?a qu v? khng c?i thi?n sau 6 thng.  Dng cc xung sng xung kch n?ng l??ng cao ?? kch thch qu trnh lnh b?nh (li?u php sng xung kch ngoi c? th?). Tr??ng h?p ny hi?m khi x?y ra.  Tim thu?c gip gi?m vim (corticosteroid). Tr??ng h?p ny hi?m khi x?y ra. Tun th? nh?ng h??ng d?n ny ? nh: N?u qu v? c b?ng b?t kh:  Mang b?ng b?t theo ch? d?n c?a chuyn gia ch?m [place] s?c kh?e. Ch? tho ra theo ch? d?n c?a chuyn gia ch?m  s?c kh?e.  N?i l?ng b?ng b?t n?u cc ngn chn b? ?au bu?t, t ho?c tr?? nn l?nh v xanh ti. Ho?t ??ng  D?n quay l?i cc ho?t ??ng bnh th??ng khi chuyn  gia ch?m [place] s?c kh?e cho php. Khng th?c hi?n b?t k? ho?t ??ng no gy ?au. ? Cn nh?c cc bi t?p tc ??ng th?p, nh? ??p xe ho?c b?i.  N?u qu v? dng b?ng b?t kh, ha?y ho?i chuyn gia ch?m so?c s??c kho?e khi na?o qu v? co? th? la?i xe an toa?n.  N?u ????c chi? ?i?nh v?t ly? tri? li?u, ha?y t?p cc bi t?p theo chi? d?n cu?a chuyn gia ch?m so?c s??c kho?e ho?c bc s? v?t l tr? li?u. X? tr ?au, c?ng kh?p v s?ng n?   Nng (nng cao) bn chn quy? vi? ln cao h?n tim khi qu v? ng?i ho?c n?m.  C? ??ng ngn chn th??ng xuyn ?? trnh c?ng kh?p v lm gi?m s?ng.  N?u ???c ch? d?n, ch??m ? l?nh ln vng b? t?n th??ng: ? Cho ? l?nh vo ti ni lng. ? ?? kh?n t?m ? gi?a da v ti ch??m. ? Ch??m ? l?nh trong kho?ng 20 pht, 2-3 l?n m?i ngy. H??ng d?n chung  N?u ???c ch? d?n, qu?n bn chn b?ng b?ng thun ho?c lo?i b?ng qu?n khc. Vi?c ny c th? gi? cho gn c?a qu v? khng c? ??ng qu nhi?u trong khi lnh l?i. Chuyn gia ch?m [place] s?c kh?e s? h??ng d?n qu v? cch qu?n b?ng bn chn ?ng cch.  Ch? ?i giy nng ?? ho?c mi?ng lt nng gt chn theo ch? d?n c?a chuyn gia ch?m [place] s?c  kh?e.  Ch? s? d?ng thu?c khng k ??n v thu?c k ??n theo ch? d?n c?a chuyn gia ch?m [place] s?c kh?e.  Tun th? t?t c? cc l?n khm theo di theo ch? d?n c?a chuyn gia ch?m [place] s?c kh?e. ?i?u ny c vai tr quan tr?ng. Hy lin l?c v?i chuyn gia ch?m [place] s?c kh?e n?u:  Qu v? c cc tri?u ch?ng tr?m tr?ng h?n.  Quy? vi? bi? ?au khng ??? sau khi du?ng thu?c.  Qu v? c nh?ng tri?u ch?ng m?i, khng r nguyn nhn.  Qu v? th?y ?m v s?ng ? bn chn.  Qu v? b? s?t. Yu c?u tr? gip ngay l?p t?c n?u:  Qu v? c?m th?y ho?c nghe th?y ti?ng b?p ??t ng?t ? gn [place] sau ? b? ?au r?t nhi?u.  Qu v? khng th? c? ??ng ngn chn ho?c bn chn.  Qu v? khng th? t ? b?t c? l?n no ln bn chn mnh. Tm t?t  Vim gn [place] l tnh tr?ng vim d?i gn c?ng gi?ng nh? dy th?ng n?i cc c? ? b?p chn v?i x??ng gt chn (gn [place]).  Tnh tr?ng ny th??ng do l?m d?ng gn v kh?p m?t c chn gy ra. N c?ng c th? do vim kh?p ho?c qu trnh lo ha bnh th??ng gy ra.  Cc tri?u ch?ng th??ng g?p nh?t c?a tnh tr?ng ny bao g?m ?au, s?ng ho?c c?ng gn [place] ho?c ? m?t sau c?a chn.  Tnh tr?ng ny th??ng ???c ?i?u tr? b?ng cch ngh? ng?i, cc thu?c ch?ng vim khng steroid (NSAID) v v?t l tr? li?u. Thng tin ny khng nh?m m?c ?ch thay th? cho l?i khuyn m chuyn gia ch?m [place] s?c kh?e ni v?i qu v?. Hy b?o ??m qu v? ph?i th?o lu?n b?t k? v?n ?? g m qu v? c v?i chuyn gia ch?m [place] s?c kh?e c?a qu v?. Document Released: 03/03/2005 Document Revised: 10/07/2016 Document Reviewed: 10/07/2016 Elsevier Patient Education  [place] chp xoay Rotator Cuff  Tear  Rch chp xoay l tnh tr?ng rch hon ton ho?c m?t ph?n cc d?i gi?ng nh? dy th?ng (gn) n?i c? v?i x??ng ? chp xoay. Chp xoay l m?t nhm c? v gn bao quanh kh?p vai v gi? cho x??ng cnh tay trn (x??ng cnh tay) ? trong h?c vai. Rch c th? x?y ra ??t ng?t (rch c?p tnh) ho?c c th? pht  tri?n trong th?i gian di (rch m?n tnh). Nguyn nhn g gy ra? Rch c?p tnh c th? la? do:  Ng, ??c bi?t l trn cnh tay du?i ra.  Nng cc v?t r?t n?ng b?ng ??ng tc x?c m?nh thnh lnh. Rch m?n tnh c th? l do l?m d?ng c?. ?i?u ny c th? x?y ra khi t?p th? thao, lao ??ng chn tay ho?c cc ho?t ??ng trong ? cnh tay ??a ln qu ??u l?p ?i l?p l?i. ?i?u g lm t?ng nguy c?? Tnh tr?ng ny d? x?y ra h?n ?:  V?n ??ng vin th? thao v cng nhn th??ng xuyn dng vai ho?c v?i qua ??u. ?i?u ny c th? bao g?m cc ho?t ??ng nh? l: ? Qu?n v?t. ? Bng chy v bng m?m. ? B?i v cho thuy?n. ? Nng t?. ? Cng nhn xy d?ng. ? S?n.  Nh?ng ng??i ht thu?c.  Ng??i l?n tu?i b? vim kh?p ho?c l?u thng mu km. Nh?ng b?nh ny c th? lm cho c? v gn y?u h?n. Cc d?u hi?u ho?c tri?u ch?ng l g? Cc tri?u ch?ng c?a tnh tr?ng ph? vo lo?i v m?c ?? n?ng c?a t?n th??ng:  Rch c?p tnh c th? bao g?m c?m gic rch ??t ng?t, sau ? ?au r?t nhi?u lan t? vai trn xu?ng cnh tay v v? pha khu?u tay.  Rch m?n tnh bao g?m y?u d?n v gi?m kh? n?ng c? ??ng vai khi c?n ?au tr?m tr?ng thm. ?au th??ng tr?m tr?ng h?n vo ban ?m. C? hai lo?i ??u c th? c cc tri?u ch?ng nh?:  ?au lan (t?a) t? vai ??n cnh tay trn.  S?ng v nha?y ca?m ?au ?? ??u vai.  Gi?m ph?m vi c? ??ng.  ?au khi: ? V?i, ko ho?c nng cnh tay trn ??u. ? H? cnh tay t? trn ??u xu?ng.  Khng th? nng cnh tay sang bn.  Kh ??a cnh tay ra sau l?ng. Ch?n ?on tnh tr?ng ny nh? th? no? Tnh tr?ng ny ???c ch?n ?on d?a vo khai thc b?nh s? v khm th?c th?. Cc ki?m tra hnh ?nh c?ng c th? ???c th?c hi?n, bao g?m:  Ch?p X quang.  Ch?p MRI.  Siu m.  Ch?p CT ho?c MR kh?p vai. Trong ki?m tra ny, m?t ch?t c?n quan ???c tim vo vai c?a qu v? v sau ? hnh ?nh ???c thu nh?n. Tnh tr?ng ny ???c ?i?u tr? nh? th? no? ?i?u tr? ti?nh tra?ng ny ty thu?c vo lo?i v m?c ?? n?ng c?a tnh tr?ng. Trong cc  tr??ng h?p hi?m g?p, ?i?u tr? c th? bao g?m:  Ngh? ng?i. C th? ngh? ng?i v?i m?t b?ng ?eo gi? cho vai ??ng im (c? ??nh). Chuyn gia ch?m [place] s?c kh?e c?ng c th? khuy?n ngh? vi?c trnh cc ho?t ??ng ?i h?i nng cnh tay ln qu ??u.  Ch??m ? l?nh cho vai.  Thu?c ch?ng vim, ch?ng h?n nh? aspirin ho?c ibuprofen.  Cc bi t?p t?ng c??ng s?c m?nh v gin c?. Chuyn gia ch?m [place] s?c kh?e c th? khuy?n ngh? cc bi t?p c?  th? ?? c?i thi?n ph?m vi c? ??ng v t?ng c??ng s?c m?nh cho vai c?a qu v?. Trong cc tr??ng h?p n?ng h?n, ?i?u tr? c th? bao g?m:  V?t l tr? li?u.  Tim steroid.  Ph?u thu?t. Tun th? nh?ng h??ng d?n ny ? nh: X? tr ?au, c?ng kh?p v s?ng n?  N?u ???c ch? d?n, ch??m ? l?nh vo vng b? t?n th??ng. ? N?u qu v? dng b?ng ?eo tho ra ???c, hy tho b?ng ?eo theo ch? d?n c?a chuyn gia ch?m [place] s?c kh?e. ? Cho ? l?nh vo ti ni lng. ? ?? kh?n t?m ? gi?a da v ti ch??m. ? Ch??m ? l?nh trong 20 pht, 2-3 l?n m?i ngy.  Nng (nng cao) vng b? th??ng ln cao h?n tim khi qu v? n?m.  Tm m?t t? th? ng? tho?i mi ho?c ng? trn gh? th? gin n?u c.  C? ??ng ngn tay th??ng xuyn ?? trnh c?ng kh?p v lm gi?m s?ng.  Sau khi h?t s?ng, chuyn gia ch?m [place] s?c kh?e c th? ch? d?n qu v? ch??m nng ?? th? l?ng c?. S? d?ng ngu?n nhi?t m chuyn gia ch?m [place] s?c kh?e khuy?n ngh?, ch?ng h?n nh? ti ch??m nhi?t ?m ho?c mi?ng ??m ch??m nng. ? ?? kh?n t?m ? gi?a da v ngu?n nhi?t. ? Duy tr ngu?n nhi?t trong 20-30 pht. ? B? ngu?n nhi?t ra n?u da qu v? chuy?n sang mu ?? nh?t. ?i?u ny ??c bi?t quan tr?ng n?u qu v? khng th? c?m th?y ?au, nng, hay l?nh. Qu v? c th? c nguy c? b? b?ng cao h?n. N?u qu v? s? d?ng b?ng ?eo:  Mang b?ng ?eo theo ch? d?n c?a chuyn gia ch?m [place] s?c kh?e. Ch? tho ra theo ch? d?n c?a chuyn gia ch?m [place] s?c kh?e.  N?i l?ng b?ng ?eo n?u cc ngn tay b? ?au bu?t, t ho?c tr?? nn l?nh v xanh ti.  Gi? cho b?ng ?eo s?ch s?.  N?u b?ng  ?eo khng ph?i lo?i ch?ng th?m n??c: ? Khng ?? n b? ??t. ? B?c n b?ng l?p ph? khng th?m n??c khi qu v? t?m b?n ho?c t?m vi sen. Li xe  Khng li xe ho?c v?n hnh my mc h?ng n?ng trong khi dng thu?c gi?m ?au ???c k ??n.  Ha?y ho?i chuyn gia ch?m so?c s??c kho?e khi na?o co? th? la?i xe an toa?n n?u quy? vi? c b?ng ?eo trn tay. Ho?t ??ng  Cho vai ngh? ng?i theo ch? d?n c?a chuyn gia ch?m [place] s?c kh?e.  Tr? l?i sinh ho?t bnh th??ng theo ch? d?n c?a chuyn gia ch?m [place] s?c kh?e. H?i chuyn gia ch?m [place] s?c kh?e v? cc ho?t ??ng no an ton cho qu v?.  T?p cc bi t?p v gin c? theo ch? d?n c?a chuyn gia ch?m [place] s?c kh?e. H??ng d?n chung  Khng s? d?ng b?t k? s?n ph?m no ch?a nicotine ho?c thu?c l, ch?ng ha?n nh? thu?c l d?ng ht v thu?c l ?i?n t?. N?u qu v? c?n gip ?? ?? cai thu?c, hy h?i chuyn gia ch?m [place] s?c kh?e.  Ch? s? d?ng thu?c khng k ??n v thu?c k ??n theo ch? d?n c?a chuyn gia ch?m [place] s?c kh?e.  Tun th? t?t c? cc l?n khm theo di theo ch? d?n c?a chuyn gia ch?m [place] s?c kh?e. ?i?u ny c vai tr quan tr?ng. Hy lin l?c v?i chuyn gia ch?m [place] s?c kh?e n?u:  ?au tr? nn tr?m  tr?ng h?n.  Qu v? m?i b? ?au ? cnh tay, bn tay ho?c cc ngn tay.  Thu?c khng c tc d?ng gi?m ?au cho qu v?. Yu c?u tr? gip ngay l?p t?c n?u:  Cnh tay, bn tay ho?c ngn tay c?a qu v? b? t ho?c ?au bu?t.  Cnh tay, bn tay ho?c ngn tay c?a qu v? s?ng ho?c ?au ho?c chuy?n sang mu tr?ng ho?c xanh.  Bn tay ho?c ngn tay trn tay b? th??ng c?a qu v? l?nh h?n tay bn kia. Tm t?t  Rch chp xoay l tnh tr?ng rch hon ton ho?c m?t ph?n cc d?i gi?ng nh? dy th?ng (gn) n?i c? v?i x??ng ? chp xoay.  Rch c th? x?y ra ??t ng?t (rch c?p tnh) ho?c c th? pht tri?n trong th?i gian di (rch m?n tnh).  ?i?u tr? th??ng bao g?m ngh? ng?i, dng thu?c ch?ng vim v ch??m ? l?nh. Trong m?t s? tr??ng h?p, c th? c?n ??n v?t l tr? li?u ho?c tim  steroid. Trong cc tr??ng h?p n?ng, c th? c?n ph?i ph?u thu?t. Thng tin ny khng nh?m m?c ?ch thay th? cho l?i khuyn m chuyn gia ch?m [place] s?c kh?e ni v?i qu v?. Hy b?o ??m qu v? ph?i th?o lu?n b?t k? v?n ?? g m qu v? c v?i chuyn gia ch?m [place] s?c kh?e c?a qu v?. Document Released: 05/24/2005 Document Revised: 02/14/2017 Document Reviewed: 02/14/2017 Elsevier Patient Education  2020 [employer].

## 2019-01-25 ENCOUNTER — Encounter: Payer: Self-pay | Admitting: Nurse Practitioner

## 2019-03-28 ENCOUNTER — Ambulatory Visit
Admission: RE | Admit: 2019-03-28 | Discharge: 2019-03-28 | Disposition: A | Discharge status: Home / Self Care | Payer: Commercial Managed Care - PPO | Attending: Nurse Practitioner | Admitting: Nurse Practitioner

## 2019-03-28 ENCOUNTER — Encounter: Payer: Self-pay | Admitting: Nurse Practitioner

## 2019-03-28 ENCOUNTER — Other Ambulatory Visit: Payer: Self-pay

## 2019-03-28 ENCOUNTER — Ambulatory Visit (INDEPENDENT_AMBULATORY_CARE_PROVIDER_SITE_OTHER): Payer: Commercial Managed Care - PPO | Admitting: Nurse Practitioner

## 2019-03-28 ENCOUNTER — Ambulatory Visit
Admission: RE | Admit: 2019-03-28 | Discharge: 2019-03-28 | Disposition: A | Discharge status: Home / Self Care | Payer: Commercial Managed Care - PPO | Source: Ambulatory Visit | Attending: Nurse Practitioner | Admitting: Nurse Practitioner

## 2019-03-28 VITALS — BP 111/72 | HR 79 | Ht 66.0 in | Wt 131.2 lb

## 2019-03-28 DIAGNOSIS — Z23 Encounter for immunization: Secondary | ICD-10-CM | POA: Diagnosis not present

## 2019-03-28 DIAGNOSIS — M25571 Pain in right ankle and joints of right foot: Secondary | ICD-10-CM | POA: Insufficient documentation

## 2019-03-28 NOTE — Progress Notes (Signed)
Subjective:    Patient ID: Melvin Ramos, male    DOB: 10/21/1965, 53 y.o.   MRN: [id number]  Melvin Ramos is a 53 y.o. male presenting on 03/28/2019 for Joint Swelling (Right ankle pain and swelling not related to injury x 1 week )  Patient is accompanied by his daughter, [name], who is interpreting today.  HPI RIGHT ankle pain and swelling Onset of RIGHT ankle pain and swelling 1 week ago.  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  Notes achy painful, and swollen at home.  At work it causes pain.  Pain most in upper ankle.  When sleeping, it is not bothering him.   Stands working for 8 hours. - Is using garlic/alcohol externally without relief. - Is unable to tie his work boots.  Social History   Tobacco Use  . Smoking status: Current Every Day Smoker    Packs/day: 1.50    Years: 30.00    Pack years: 45.00    Types: Cigarettes  . Smokeless tobacco: Never Used  Substance Use Topics  . Alcohol use: Yes    Comment: "sometimes"  . Drug use: No    Review of Systems Per HPI unless specifically indicated above     Objective:    BP 111/72 (BP Location: Right Arm, Patient Position: Sitting, Cuff Size: Normal)   Pulse 79   Ht 5\' 6"  (1.676 m)   Wt 131 lb 3.2 oz (59.5 kg)   BMI 21.18 kg/m   Wt Readings from Last 3 Encounters:  03/28/19 131 lb 3.2 oz (59.5 kg)  06/20/18 127 lb 9.6 oz (57.9 kg)  03/15/18 129 lb 6.4 oz (58.7 kg)    Physical Exam Vitals signs reviewed.  Constitutional:      General: He is not in acute distress.    Appearance: Normal appearance. He is well-developed and normal weight.  HENT:     Head: Normocephalic and atraumatic.  Cardiovascular:     Rate and Rhythm: Normal rate and regular rhythm.     Pulses: Normal pulses.     Heart sounds: Normal heart sounds.  Pulmonary:     Effort: Pulmonary effort is normal.     Breath sounds: Normal breath sounds.  Musculoskeletal:     Comments: RIGHT ankle Inspection: +2 pitting edema above  foot localized at ankle, mild +1 edema of foot, without ecchymosis, effusion Palpation: tender anterior lateral ankle.  Nontender medial/lateral malleolus.  Nontender medial ankle. ROM: normal PROM extension, eversion.  Reduced flexion, inversion with pain. Special Testing: none Strength: decreased strength against resistance 3/5 flexion, 4/5 extension Neurovascular: intact pulses, good cap refill, normal sensation to light touch   Skin:    General: Skin is warm and dry.     Capillary Refill: Capillary refill takes less than 2 seconds.  Neurological:     General: No focal deficit present.     Mental Status: He is alert and oriented to person, place, and time. Mental status is at baseline.  Psychiatric:        Mood and Affect: Mood normal.        Behavior: Behavior normal.        Thought Content: Thought content normal.        Judgment: Judgment normal.    Results for orders placed or performed in visit on 06/20/18  Lipid panel  Result Value Ref Range   Cholesterol 169 <200 mg/dL   HDL 41 >40 mg/dL   Triglycerides 152 (H) <150 mg/dL  LDL Cholesterol (Calc) 103 (H) mg/dL (calc)   Total CHOL/HDL Ratio 4.1 <5.0 (calc)   Non-HDL Cholesterol (Calc) 128 <130 mg/dL (calc)  TSH  Result Value Ref Range   TSH 1.24 0.40 - 4.50 mIU/L  PSA  Result Value Ref Range   PSA 0.5 < OR = 4.0 ng/mL  Hemoglobin A1c  Result Value Ref Range   Hgb A1c MFr Bld 5.3 <5.7 % of total Hgb   Mean Plasma Glucose 105 (calc)   eAG (mmol/L) 5.8 (calc)  Hepatitis C antibody  Result Value Ref Range   Hepatitis C Ab NON-REACTIVE NON-REACTI   SIGNAL TO CUT-OFF 0.03 <1.00  COMPLETE METABOLIC PANEL WITH GFR  Result Value Ref Range   Glucose, Bld 72 65 - 99 mg/dL   BUN 20 7 - 25 mg/dL   Creat 1.11 0.70 - 1.33 mg/dL   GFR, Est Non African American 75 > OR = 60 mL/min/1.42m2   GFR, Est African American 87 > OR = 60 mL/min/1.2m2   BUN/Creatinine Ratio NOT APPLICABLE 6 - 22 (calc)   Sodium 138 135 - 146 mmol/L    Potassium 4.9 3.5 - 5.3 mmol/L   Chloride 104 98 - 110 mmol/L   CO2 25 20 - 32 mmol/L   Calcium 9.9 8.6 - 10.3 mg/dL   Total Protein 7.2 6.1 - 8.1 g/dL   Albumin 4.4 3.6 - 5.1 g/dL   Globulin 2.8 1.9 - 3.7 g/dL (calc)   AG Ratio 1.6 1.0 - 2.5 (calc)   Total Bilirubin 0.4 0.2 - 1.2 mg/dL   Alkaline phosphatase (APISO) 48 40 - 115 U/L   AST 18 10 - 35 U/L   ALT 13 9 - 46 U/L  CBC with Differential/Platelet  Result Value Ref Range   WBC 6.0 3.8 - 10.8 Thousand/uL   RBC 5.01 4.20 - 5.80 Million/uL   Hemoglobin 16.3 13.2 - 17.1 g/dL   HCT 47.3 38.5 - 50.0 %   MCV 94.4 80.0 - 100.0 fL   MCH 32.5 27.0 - 33.0 pg   MCHC 34.5 32.0 - 36.0 g/dL   RDW 13.1 11.0 - 15.0 %   Platelets 221 140 - 400 Thousand/uL   MPV 11.5 7.5 - 12.5 fL   Neutro Abs 2,772 1,500 - 7,800 cells/uL   Lymphs Abs 2,622 850 - 3,900 cells/uL   Absolute Monocytes 420 200 - 950 cells/uL   Eosinophils Absolute 156 15 - 500 cells/uL   Basophils Absolute 30 0 - 200 cells/uL   Neutrophils Relative % 46.2 %   Total Lymphocyte 43.7 %   Monocytes Relative 7.0 %   Eosinophils Relative 2.6 %   Basophils Relative 0.5 %  HIV Antibody (routine testing w rflx)  Result Value Ref Range   HIV 1&2 Ab, 4th Generation NON-REACTIVE NON-REACTI      Assessment & Plan:   Problem List Items Addressed This Visit    None    Visit Diagnoses    Acute right ankle pain    -  Primary   Relevant Orders   DG Ankle Complete Right      Likely high ankle sprain vs fibula fracture.  Pain likely self-limited.  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  Plan:  1. Treat with OTC pain meds (acetaminophen and ibuprofen).  Discussed alternate dosing and max dosing. 2. Apply heat and/or ice to affected area. 3. May also apply a muscle rub with lidocaine or lidocaine patch  after heat or ice. 4. Ankle xray indicated due to location of pain.   5. Follow up 2-4 weeks  prn.   Follow up plan: Follow-up 2-4 weeks prn.  Cassell Smiles, DNP, AGPCNP-BC Adult Gerontology Primary Care Nurse Practitioner [facility] 03/28/2019, 8:53 AM

## 2019-03-28 NOTE — Patient Instructions (Signed)
Bong gn c? chn Ankle Sprain  Bong gn c? chn l tnh tr?ng gin ho?c rch m?t dy ch?ng ? c? chn. Dy ch?ng l cc m n?i cc x??ng v?i nhau. Hai lo?i bong gn c? chn th??ng g?p nh?t l:  Bong gn v?n trong. Tnh tr?ng ny x?y ra khi bn chn xoay vo trong v c? chn cu?n ra ngoi. Tnh tr?ng ny ?nh h??ng ??n dy ch?ng n?m ? m?t ngoi c?a bn chn (dy ch?ng bn).  Bong gn v?n ngoi. Tnh tr?ng ny x?y ra khi bn chn xoay ra ngoi v c? chn cu?n vo trong. Tnh tr?ng ny ?nh h??ng ??n dy ch?ng n?m ? m?t trong c?a bn chn (dy ch?ng gi?a). Nguyn nhn g gy ra? Tnh tr?ng ny th??ng do v tnh xoay ho?c v?n c? chn gy ra. ?i?u g lm t?ng nguy c?? Tnh tr?ng ny d? x?y ra h?n n?u qu v? ch?i th? thao. Cc d?u hi?u ho?c tri?u ch?ng l g? Nh?ng tri?u ch?ng c?a tnh tr?ng ny bao g?m:  ?au ?? c? chn.  S?ng n?.  B?m tm. B?m ti?m co? th? pha?t sinh ngay sau khi quy? vi? bi? bong gn c? chn ho??c 1-2 nga?y sau.  Kho? ???ng ho??c kh ?i la?i, ???c bi?t l khi quy? vi? quay ng???i ho??c ??i h???ng. Ch?n ?on tnh tr?ng ny nh? th? no? Tnh tr?ng ny ???c ch?n ?on b?ng:  Khm th?c th?[place] lu?c kha?m, chuyn gia ch?m so?c s??c kho?e se? ?n va?o m?t s? ph?n nh?t ??nh ? ba?n chn va? c? chn va? th?? di chuy?n cc ph?n ny theo m?t s? h???ng nh?t ??nh.  Ch?p x quang. Chu?p x quang c th? ???c th?c hi?n ?? ki?m tra m??c ?? n?ng c?a bong gn va? ki?m tra xem co? x??ng ga?y hay khng. Tnh tr?ng ny ???c ?i?u tr? nh? th? no? Tnh tr?ng ny c th? ???c ?i?u tr? b?ng:  ?ai n?p ho??c ne?p. Lo?i ny ????c s?? du?ng ?? gi?? cho c? chn khng c?? ??ng cho ??n khi la?nh la?i.  B?ng thun. B?ng na?y ????c s?? du?ng ?? h? tr?? c? chn.  Na?ng.  Thu?c gi?m ?au.  Ph?u thu?t. Bi?n pha?p na?y co? th? c?n thi?t n?u bong gn n?ng.  V?t l tr? li?u. Bi?n pha?p na?y co? th? giu?p ca?i thi?n pha?m vi v?n ??ng cu?a c? chn. Tun th? nh?ng h??ng d?n ny ?  nh: N?u qu v? s? d?ng ?ai n?p ho?c n?p:  Mang ?ai n?p ho?c n?p theo ch? d?n c?a chuyn gia ch?m [place] s?c kh?e. Ch? tho ra theo ch? d?n c?a chuyn gia ch?m [place] s?c kh?e.  N?i l?ng ?ai n?p ho?c n?p n?u cc ngn chn b? ?au bu?t, t ho?c tr?? nn l?nh v xanh ti.  Gi? cho ?ai n?p ho?c n?p s?ch s?.  N?u ?ai n?p ho?c n?p khng ph?i lo?i ch?ng th?m n??c: ? Khng ?? n b? ??t. ? B?c n b?ng l?p ph? ch?ng th?m n??c khi qu v? t?m b?n ho?c t?m vi sen. N?u quy? vi? du?ng b?ng thun (b?ng):  Tho n ra ?? t?m.  C? g??ng khng c?? ??ng c? chn nhi?u, nh?ng thi?nh thoa?ng ha?y ngo? ngoa?y ngo?n chn. Vi?c na?y giu?p ng?n ng?a s?ng n?.  ?i?u chi?nh la?i b?ng ?? c c?m gic d? chi?u h?n n?u ca?m th?y qua? ch?t.  N??i lo?ng b?ng n?u quy? vi? bi? t ho??c ?au bu?t ?? ba?n chn, ho??c n?u ba?n chn quy? vi? tr?? nn la?nh va? xanh.  X? tr ?au, c?ng kh?p v s?ng n?   Ch? s? d?ng thu?c khng k ??n v thu?c k ??n theo ch? d?n c?a chuyn gia ch?m [place] s?c kh?e.  Trong 2-3 nga?y, gi?? cho c? chn cu?a quy? vi? cao ln (nng cao) trn m??c cu?a tim ca?ng nhi?u ca?ng t?t.  N?u ???c ch? d?n, hy ch??m ? l?nh ln vng b? t?n th??ng: ? N?u qu v? dng ?ai n?p ho?c n?p tho ra ???c, hy tho theo ch? d?n c?a chuyn gia ch?m [place] s?c kh?e. ? Cho ? l?nh vo ti ni lng. ? ?? kh?n t?m ? gi?a da v ti ch??m. ? Ch??m ? l?nh trong 20 pht, 2-3 l?n m?i ngy. H??ng d?n chung  Cho c? chn nghi? ng?i.  Khng s? d?ng chn b? th??ng ?? ?? s?c n?ng c? th? c?a qu v? cho ??n khi chuyn gia ch?m [place] s?c kh?e ni qu v? c th? lm nh? v?y. S? d?ng n?ng theo ch? d?n c?a chuyn gia ch?m [place] s?c kh?e.  Khng s? d?ng b?t k? s?n ph?m no c nicotine ho?c thu?c l, ch?ng ha?n nh? thu?c l d?ng ht, thu?c l ?i?n t? v thu?c l d?ng nhai. N?u qu v? c?n gip ?? ?? cai thu?c, hy h?i chuyn gia ch?m [place] s?c kh?e.  Tun th? t?t c? cc l?n khm theo di theo ch? d?n c?a chuyn gia ch?m [place] s?c kh?e. ?i?u ny  c vai tr quan tr?ng. Hy lin l?c v?i chuyn gia ch?m [place] s?c kh?e n?u:  Qu v? b? thm tm ho?c s?ng t?ng ln nhanh cho?ng.  Quy? vi? khng h?t ?au sau khi dng thu?c. Yu c?u tr? gip ngay l?p t?c n?u:  Bn chn ho?c ngn chn c?a qu v? tr?? nn t ho??c xanh.  Qu v? b? ?au r?t nhi?u va? ngy cng nhi?u. Tm t?t  Bong gn c? chn l tnh tr?ng gin ho?c rch m?t dy ch?ng ? c? chn. Dy ch?ng l cc m n?i cc x??ng v?i nhau.  Tnh tr?ng ny th??ng do v tnh xoay ho?c v?n c? chn gy ra.  Cc tri?u ch?ng bao g?m ?au, s?ng, b?m tm v kh ?i l?i.  ?? gi?m ?au v s?ng n?, ch??m ? ln c? chn b? ?nh h??ng, nng c? chn ln cao h?n m?c tim v dng b?ng thun.  Tun th? t?t c? cc l?n khm theo di theo ch? d?n c?a chuyn gia ch?m [place] s?c kh?e. ?i?u ny c vai tr quan tr?ng. Thng tin ny khng nh?m m?c ?ch thay th? cho l?i khuyn m chuyn gia ch?m [place] s?c kh?e ni v?i qu v?. Hy b?o ??m qu v? ph?i th?o lu?n b?t k? v?n ?? g m qu v? c v?i chuyn gia ch?m [place] s?c kh?e c?a qu v?. Document Released: 05/24/2005 Document Revised: 11/25/2017 Document Reviewed: 11/25/2017 Elsevier Patient Education  [place].    Ankle Sprain, Phase I Rehab An ankle sprain is an injury to the ligaments of your ankle. Ankle sprains cause stiffness, loss of motion, and loss of strength. Ask your health care provider which exercises are safe for you. Do exercises exactly as told by your health care provider and adjust them as directed. It is normal to feel mild stretching, pulling, tightness, or discomfort as you do these exercises. Stop right away if you feel sudden pain or your pain gets worse. Do not begin these exercises until told by your health care provider. Stretching and range-of-motion exercises These exercises warm  up your muscles and joints and improve the movement and flexibility of your lower leg and ankle. These exercises also help to relieve pain and stiffness. Gastroc and  soleus stretch This exercise is also called a calf stretch. It stretches the muscles in the back of the lower leg. These muscles are the gastrocnemius, or gastroc, and the soleus. 1. Sit on the floor with your left / right leg extended. 2. Loop a belt or towel around the ball of your left / right foot. The ball of your foot is on the walking surface, right under your toes. 3. Keep your left / right ankle and foot relaxed and keep your knee straight while you use the belt or towel to pull your foot toward you. You should feel a gentle stretch behind your calf or knee in your gastroc muscle. 4. Hold this position for __________ seconds, then release to the starting position. 5. Repeat the exercise with your knee bent. You can put a pillow or a rolled bath towel under your knee to support it. You should feel a stretch deep in your calf in the soleus muscle or at your Achilles tendon. Repeat __________ times. Complete this exercise __________ times a day. Ankle alphabet  1. Sit with your left / right leg supported at the lower leg. ? Do not rest your foot on anything. ? Make sure your foot has room to move freely. 2. Think of your left / right foot as a paintbrush. ? Move your foot to trace each letter of the alphabet in the air. Keep your hip and knee still while you trace. ? Make the letters as large as you can without feeling discomfort. 3. Trace every letter from A to Z. Repeat __________ times. Complete this exercise __________ times a day. Strengthening exercises These exercises build strength and endurance in your ankle and lower leg. Endurance is the ability to use your muscles for a long time, even after they get tired. Ankle dorsiflexion  1. Secure a rubber exercise band or tube to an object, such as a table leg, that will stay still when the band is pulled. Secure the other end around your left / right foot. 2. Sit on the floor facing the object, with your left / right leg extended. The  band or tube should be slightly tense when your foot is relaxed. 3. Slowly bring your foot toward you, bringing the top of your foot toward your shin (dorsiflexion), and pulling the band tighter. 4. Hold this position for __________ seconds. 5. Slowly return your foot to the starting position. Repeat __________ times. Complete this exercise __________ times a day. Ankle plantar flexion  1. Sit on the floor with your left / right leg extended. 2. Loop a rubber exercise tube or band around the ball of your left / right foot. The ball of your foot is on the walking surface, right under your toes. ? Hold the ends of the band or tube in your hands. ? The band or tube should be slightly tense when your foot is relaxed. 3. Slowly point your foot and toes downward to tilt the top of your foot away from your shin (plantar flexion). 4. Hold this position for __________ seconds. 5. Slowly return your foot to the starting position. Repeat __________ times. Complete this exercise __________ times a day. Ankle eversion 1. Sit on the floor with your legs straight out in front of you. 2. Loop a rubber exercise band or tube around the ball  of your left / right foot. The ball of your foot is on the walking surface, right under your toes. ? Hold the ends of the band in your hands, or secure the band to a stable object. ? The band or tube should be slightly tense when your foot is relaxed. 3. Slowly push your foot outward, away from your other leg (eversion). 4. Hold this position for __________ seconds. 5. Slowly return your foot to the starting position. Repeat __________ times. Complete this exercise __________ times a day. This information is not intended to replace advice given to you by your health care provider. Make sure you discuss any questions you have with your health care provider. Document Released: 12/23/2004 Document Revised: 09/12/2018 Document Reviewed: 03/06/2018 Elsevier Patient Education   [place].   Ankle Sprain, Phase II Rehab An ankle sprain is an injury to tissue that connects bone to bone (a ligament) in the ankle. Ankle sprains usually cause stiffness, loss of motion, and loss of strength. Ask your health care provider which exercises are safe for you. Do exercises exactly as told by your health care provider and adjust them as directed. It is normal to feel mild stretching, pulling, tightness, or discomfort as you do these exercises. Stop right away if you feel sudden pain or your pain gets worse. Do not begin these exercises until told by your health care provider. Stretching and range-of-motion exercises These exercises warm up your muscles and joints and improve the movement and flexibility of your lower leg and ankle. These exercises also help to relieve pain and stiffness. Standing gastroc stretch This exercise is also called a standing calf (gastroc) stretch. 1. Stand with your hands against a wall. 2. Extend your left / right leg behind you, and bend your front knee slightly. Your heels should be on the floor. 3. Keeping your heels on the floor and your back knee straight, shift your weight toward the wall. You should feel a gentle stretch in the back of your lower leg (calf). 4. Hold this position for __________ seconds. Repeat __________ times. Complete this exercise __________ times a day. Standing soleus stretch This exercise is also called a standing calf (soleus) stretch. 1. Stand with your hands against a wall. 2. Extend your left / right leg behind you, and bend your front knee slightly. Both of your heels should be on the floor. 3. Keeping your heels on the floor, bend your back knee and shift your weight slightly over your back leg. You should feel a gentle stretch deep in your calf. 4. Hold this position for __________ seconds. Repeat __________ times. Complete this exercise __________ times a day. Strengthening exercises These exercises build  strength and endurance in your lower leg. Endurance is the ability to use your muscles for a long time, even after they get tired. Heel walking  This exercise is sometimes called dorsiflexion. 1. Walk on your heels for __________ seconds or ___________ ft. Keep your toes as high as possible. Repeat __________ times. Complete this exercise __________ times a day. Balance exercises These exercises improve your balance and the reaction and control of your ankle to help improve stability. Multi-angle lunge 1. Stand with your feet together. 2. Take a step forward with your left / right leg, and shift your weight onto that leg. Your back heel will come off the floor, and your back toes will stay in place. 3. Push off your front leg to return your front foot to the starting position  next to your other foot. 4. Repeat to the side, to the back, and any other directions as told by your health care provider. Repeat __________ times. Complete this exercise __________ times a day. Single leg stand If this exercise is too easy, you can try it with your eyes closed or while standing on a pillow. 1. Without shoes, stand near a railing or in a door frame. Hold on to the railing or door frame as needed. Let loose of the railing or door frame as you are able. 2. Stand on your left / right foot. Keep your big toe down on the floor and try to keep your arch lifted. 3. Hold this position for __________ seconds. Repeat __________ times. Complete this exercise __________ times a day. Ankle inversion and eversion This exercise is also called foot rotation with a balance board. This exercise uses a balance board to rotate the foot and ankle inward (inversion) and outward (eversion). Ask your health care provider where you can get a balance board or how you can make one. 1. Stand on a non-carpeted surface near a countertop or wall. 2. Step onto the balance board so your feet are hip width apart. 3. Keep your feet in  place and keep your upper body and hips steady. 4. Using only your feet and ankles to move the board, do the following exercises as told by your health care provider: ? Tip the board side to side as far as you can, alternating between tipping to the left and tipping to the right. ? Tip the board so it silently taps the floor. Do not let the board forcefully hit the floor. ? From time to time, pause to hold a steady midway position, with neither the right nor the left sides touching the ground. ? Tip the board side to side so the board does not hit the floor at all. From time to time, pause to hold a steady midway position. Repeat __________ times. Complete this exercise __________ times a day. Ankle plantar flexion and dorsiflexion This exercise is also called foot flexion with a balance board. This exercise uses a balance board to push the foot downward and away from the leg (plantar flexion) or upward and toward the leg (dorsiflexion). Ask your health care provider where you can get a balance board or how you can make one. 1. Stand on a non-carpeted surface near a countertop or wall. 2. Step onto the balance board so your feet are hip width apart. 3. Keep your feet in place and keep your upper body and hips steady. 4. Using only your feet and ankles to move the board, do one or both of the following exercises as told by your health care provider: ? Tip the board forward and backward so the board silently taps the floor. Do not let the board forcefully hit the floor. ? From time to time, pause to hold a steady position midway between touching the floor in front and touching the floor in back. ? Tip the board forward and backward so the board does not hit the floor at all. From time to time, pause to hold a steady position in the middle. Repeat __________ times. Complete this exercise __________ times a day. This information is not intended to replace advice given to you by your health care  provider. Make sure you discuss any questions you have with your health care provider. Document Released: 09/13/2005 Document Revised: 09/12/2018 Document Reviewed: 03/06/2018 Elsevier Patient Education  2020 [employer]  

## 2019-06-12 ENCOUNTER — Other Ambulatory Visit: Payer: Self-pay | Admitting: Nurse Practitioner

## 2019-06-12 ENCOUNTER — Telehealth: Payer: Self-pay | Admitting: Nurse Practitioner

## 2019-06-12 DIAGNOSIS — J410 Simple chronic bronchitis: Secondary | ICD-10-CM

## 2019-06-12 MED ORDER — ALBUTEROL SULFATE HFA 108 (90 BASE) MCG/ACT IN AERS: 1.0000 | INHALATION_SPRAY | Freq: Four times a day (QID) | RESPIRATORY_TRACT | 2 refills | Status: DC | PRN

## 2019-06-12 NOTE — Telephone Encounter (Signed)
Melvin Ramos wanted to know if we would call an inhaler in for the pt to [facility]  Phillip Heal

## 2019-07-09 ENCOUNTER — Ambulatory Visit: Payer: Self-pay

## 2019-07-09 ENCOUNTER — Ambulatory Visit (INDEPENDENT_AMBULATORY_CARE_PROVIDER_SITE_OTHER): Payer: Commercial Managed Care - PPO | Admitting: Family Medicine

## 2019-07-09 ENCOUNTER — Emergency Department: Payer: Commercial Managed Care - PPO

## 2019-07-09 ENCOUNTER — Emergency Department
Admission: EM | Admit: 2019-07-09 | Discharge: 2019-07-09 | Disposition: A | Discharge status: Home / Self Care | Payer: Commercial Managed Care - PPO | Attending: Emergency Medicine | Admitting: Emergency Medicine

## 2019-07-09 ENCOUNTER — Other Ambulatory Visit: Payer: Self-pay

## 2019-07-09 ENCOUNTER — Encounter: Payer: Self-pay | Admitting: Family Medicine

## 2019-07-09 DIAGNOSIS — F17210 Nicotine dependence, cigarettes, uncomplicated: Secondary | ICD-10-CM | POA: Insufficient documentation

## 2019-07-09 DIAGNOSIS — J441 Chronic obstructive pulmonary disease with (acute) exacerbation: Secondary | ICD-10-CM | POA: Insufficient documentation

## 2019-07-09 DIAGNOSIS — J410 Simple chronic bronchitis: Secondary | ICD-10-CM

## 2019-07-09 DIAGNOSIS — R0602 Shortness of breath: Secondary | ICD-10-CM | POA: Diagnosis not present

## 2019-07-09 DIAGNOSIS — Z79899 Other long term (current) drug therapy: Secondary | ICD-10-CM | POA: Diagnosis not present

## 2019-07-09 DIAGNOSIS — Z20822 Contact with and (suspected) exposure to covid-19: Secondary | ICD-10-CM | POA: Insufficient documentation

## 2019-07-09 LAB — BASIC METABOLIC PANEL
Anion gap: 8 (ref 5–15)
BUN: 20 mg/dL (ref 6–20)
CO2: 23 mmol/L (ref 22–32)
Calcium: 8.9 mg/dL (ref 8.9–10.3)
Chloride: 109 mmol/L (ref 98–111)
Creatinine, Ser: 0.71 mg/dL (ref 0.61–1.24)
GFR calc Af Amer: >60 mL/min (ref >60)
GFR calc non Af Amer: >60 mL/min (ref >60)
Glucose, Bld: 97 mg/dL (ref 70–99)
Potassium: 3.8 mmol/L (ref 3.5–5.1)
Sodium: 140 mmol/L (ref 135–145)

## 2019-07-09 LAB — CBC WITH DIFFERENTIAL/PLATELET
Abs Immature Granulocytes: 0.04 10*3/uL (ref 0.00–0.07)
Basophils Absolute: 0.1 10*3/uL (ref 0.0–0.1)
Basophils Relative: 1 %
Eosinophils Absolute: 0.7 10*3/uL — ABNORMAL HIGH (ref 0.0–0.5)
Eosinophils Relative: 8 %
HCT: 46.2 % (ref 39.0–52.0)
Hemoglobin: 15.6 g/dL (ref 13.0–17.0)
Immature Granulocytes: 1 %
Lymphocytes Relative: 27 %
Lymphs Abs: 2.3 10*3/uL (ref 0.7–4.0)
MCH: 31.5 pg (ref 26.0–34.0)
MCHC: 33.8 g/dL (ref 30.0–36.0)
MCV: 93.3 fL (ref 80.0–100.0)
Monocytes Absolute: 0.6 10*3/uL (ref 0.1–1.0)
Monocytes Relative: 6 %
Neutro Abs: 5.2 10*3/uL (ref 1.7–7.7)
Neutrophils Relative %: 57 %
Platelets: 210 10*3/uL (ref 150–400)
RBC: 4.95 MIL/uL (ref 4.22–5.81)
RDW: 12.8 % (ref 11.5–15.5)
WBC: 8.8 10*3/uL (ref 4.0–10.5)
nRBC: 0 % (ref 0.0–0.2)

## 2019-07-09 LAB — TROPONIN I (HIGH SENSITIVITY): Troponin I (High Sensitivity): 5 ng/L (ref <18)

## 2019-07-09 LAB — RESPIRATORY PANEL BY RT PCR (FLU A&B, COVID)
Influenza A by PCR: NEGATIVE
Influenza B by PCR: NEGATIVE
SARS Coronavirus 2 by RT PCR: NEGATIVE

## 2019-07-09 MED ORDER — AZITHROMYCIN 250 MG PO TABS: ORAL_TABLET | ORAL | 0 refills | Status: AC

## 2019-07-09 MED ORDER — METHYLPREDNISOLONE SODIUM SUCC 125 MG IJ SOLR
125.0000 mg | Freq: Once | INTRAMUSCULAR | Status: AC
  Administered 2019-07-09: 13:00:00 125 mg via INTRAVENOUS
  Filled 2019-07-09: qty 2

## 2019-07-09 MED ORDER — PREDNISONE 50 MG PO TABS: 50.0000 mg | ORAL_TABLET | Freq: Every day | ORAL | 0 refills | Status: DC

## 2019-07-09 MED ORDER — IPRATROPIUM-ALBUTEROL 0.5-2.5 (3) MG/3ML IN SOLN
3.0000 mL | Freq: Once | RESPIRATORY_TRACT | Status: AC
  Administered 2019-07-09: 3 mL via RESPIRATORY_TRACT

## 2019-07-09 MED ORDER — PREDNISONE 20 MG PO TABS: 40.0000 mg | ORAL_TABLET | Freq: Every day | ORAL | 0 refills | Status: AC

## 2019-07-09 MED ORDER — IPRATROPIUM-ALBUTEROL 0.5-2.5 (3) MG/3ML IN SOLN
3.0000 mL | Freq: Once | RESPIRATORY_TRACT | Status: AC
  Administered 2019-07-09: 3 mL via RESPIRATORY_TRACT
  Filled 2019-07-09: qty 9

## 2019-07-09 MED ORDER — ALBUTEROL SULFATE HFA 108 (90 BASE) MCG/ACT IN AERS: 1.0000 | INHALATION_SPRAY | Freq: Four times a day (QID) | RESPIRATORY_TRACT | 2 refills | Status: DC | PRN

## 2019-07-09 MED ORDER — BENZONATATE 100 MG PO CAPS: 100.0000 mg | ORAL_CAPSULE | Freq: Three times a day (TID) | ORAL | 0 refills | Status: DC | PRN

## 2019-07-09 MED ORDER — IPRATROPIUM-ALBUTEROL 0.5-2.5 (3) MG/3ML IN SOLN
3.0000 mL | Freq: Once | RESPIRATORY_TRACT | Status: AC
  Administered 2019-07-09: 13:00:00 3 mL via RESPIRATORY_TRACT

## 2019-07-09 NOTE — ED Provider Notes (Signed)
[facility] Emergency Department Provider Note  ____________________________________________   First MD Initiated Contact with Patient 07/09/19 1227     (approximate)  I have reviewed the triage vital signs and the nursing notes.   HISTORY  Chief Complaint Shortness of Breath    HPI Melvin Ramos is a 54 y.o. male who comes in with shortness of breath and cough for 10 days.  Patient states that he has had a cough for 10 days.  He states that he does smoke but has not been diagnosed with COPD although has used an inhaler previously.  He states that he has not been tested for coronavirus yet.  His shortness of breath started few days ago, constant, nothing makes it better, nothing makes it worse.  He denies really any chest pain.  No leg swelling.  No risk factors for PE.  Just currently still smoke.  Patient was seen by a telemedicine doc this morning and started on prednisone and albuterol.  However he has not picked these up yet.  We used a Psychiatric nurse          Past Medical History:  Diagnosis Date  . Hemorrhoid     Patient Active Problem List   Diagnosis Date Noted  . Intractable chronic post-traumatic headache 03/31/2017  . Simple chronic bronchitis ([place]) 03/25/2017  . Smoking greater than 30 pack years 03/23/2017  . Ruptured tympanic membrane, left 03/23/2017  . Chronic cough 03/23/2017    Past Surgical History:  Procedure Laterality Date  . NO PAST SURGERIES      Prior to Admission medications   Medication Sig Start Date End Date Taking? Authorizing Provider  albuterol (VENTOLIN HFA) 108 (90 Base) MCG/ACT inhaler Inhale 1-2 puffs into the lungs every 6 (six) hours as needed for wheezing or shortness of breath. 06/12/19   Karamalegos, Devonne Doughty, DO  diclofenac (VOLTAREN) 75 MG EC tablet Take 1 tablet (75 mg total) by mouth 2 (two) times daily. 01/19/19   Mikey College, NP  guaiFENesin (MUCINEX) 600 MG 12 hr tablet Take  1,200 mg by mouth 2 (two) times daily.    [provider]  Phenyleph-Doxylamine-DM-APAP (NYQUIL SEVERE COLD/FLU) 5-6.25-10-325 MG/15ML LIQD Take by mouth.    [provider]  predniSONE (DELTASONE) 50 MG tablet Take 1 tablet (50 mg total) by mouth daily with breakfast. 07/09/19   Parks Ranger, Devonne Doughty, DO  tiotropium (SPIRIVA HANDIHALER) 18 MCG inhalation capsule Place 1 capsule (18 mcg total) into inhaler and inhale daily. 08/29/18   Mikey College, NP    Allergies Patient has no known allergies.  Family History  Problem Relation Age of Onset  . Depression Mother   . Heart disease Mother   . Stroke Mother     Social History Social History   Tobacco Use  . Smoking status: Current Every Day Smoker    Packs/day: 1.50    Years: 30.00    Pack years: 45.00    Types: Cigarettes  . Smokeless tobacco: Never Used  Substance Use Topics  . Alcohol use: Yes    Comment: "sometimes"  . Drug use: No      Review of Systems Constitutional: No fever/chills Eyes: No visual changes. ENT: No sore throat. Cardiovascular: No chest pain Respiratory: Positive for SOB, cough Gastrointestinal: No abdominal pain.  No nausea, no vomiting.  No diarrhea.  No constipation. Genitourinary: Negative for dysuria. Musculoskeletal: Negative for back pain. Skin: Negative for rash. Neurological: Negative for headaches, focal weakness or numbness.  All other ROS negative ____________________________________________   PHYSICAL EXAM:  VITAL SIGNS: ED Triage Vitals  Enc Vitals Group     BP 07/09/19 1119 (!) 156/94     Pulse Rate 07/09/19 1119 67     Resp 07/09/19 1119 20     Temp 07/09/19 1119 98.2 F (36.8 C)     Temp Source 07/09/19 1119 Oral     SpO2 07/09/19 1119 98 %     Weight 07/09/19 1117 140 lb (63.5 kg)     Height 07/09/19 1117 5\' 2"  (1.575 m)     Head Circumference --      Peak Flow --      Pain Score 07/09/19 1116 0     Pain Loc --      Pain Edu? --       Excl. in [place]? --     Constitutional: Alert and oriented. Well appearing and in no acute distress. Eyes: Conjunctivae are normal. EOMI. Head: Atraumatic. Nose: No congestion/rhinnorhea. Mouth/Throat: Mucous membranes are moist.   Neck: No stridor. Trachea Midline. FROM Cardiovascular: Normal rate, regular rhythm. Grossly normal heart sounds.  Good peripheral circulation. Respiratory: No increased work of breathing but does have bilateral wheezing. Gastrointestinal: Soft and nontender. No distention. No abdominal bruits.  Musculoskeletal: No lower extremity tenderness nor edema.  No joint effusions. Neurologic:  Normal speech and language. No gross focal neurologic deficits are appreciated.  Skin:  Skin is warm, dry and intact. No rash noted. Psychiatric: Mood and affect are normal. Speech and behavior are normal. GU: Deferred   ____________________________________________   LABS (all labs ordered are listed, but only abnormal results are displayed)  Labs Reviewed  CBC WITH DIFFERENTIAL/PLATELET - Abnormal; Notable for the following components:      Result Value   Eosinophils Absolute 0.7 (*)    All other components within normal limits  RESPIRATORY PANEL BY RT PCR (FLU A&B, COVID)  BASIC METABOLIC PANEL  TROPONIN I (HIGH SENSITIVITY)  TROPONIN I (HIGH SENSITIVITY)   ____________________________________________   ED ECG REPORT I, Vanessa Lesslie, the attending physician, personally viewed and interpreted this ECG.  EKG shows a bifascicular block with right bundle branch block and left fascicular block but otherwise normal sinus, no ST elevation, no T wave inversions ____________________________________________  RADIOLOGY Robert Bellow, personally viewed and evaluated these images (plain radiographs) as part of my medical decision making, as well as reviewing the written report by the radiologist.  ED MD interpretation: Emphysema without evidence of pneumonia  Official  radiology report(s): DG Chest 2 View  Result Date: 07/09/2019 CLINICAL DATA:  Shortness of breath and cough for 10 days. EXAM: CHEST - 2 VIEW COMPARISON:  PA and lateral chest 03/23/2017. FINDINGS: The chest is hyperexpanded with attenuation of the pulmonary vasculature. Lungs are clear. No pneumothorax or pleural effusion. Heart size is normal. No acute or focal bony abnormality. IMPRESSION: Emphysema without acute disease. Electronically Signed   By: Inge Rise M.D.   On: 07/09/2019 12:01    ____________________________________________   PROCEDURES  Procedure(s) performed (including Critical Care):  Procedures   ____________________________________________   INITIAL IMPRESSION / ASSESSMENT AND PLAN / ED COURSE   Melvin Ramos was evaluated in Emergency Department on 07/09/2019 for the symptoms described in the history of present illness. He was evaluated in the context of the global COVID-19 pandemic, which necessitated consideration that the patient might be at risk for infection with the SARS-CoV-2 virus that causes COVID-19. Institutional protocols and algorithms that  pertain to the evaluation of patients at risk for COVID-19 are in a state of rapid change based on information released by regulatory bodies including the CDC and federal and state organizations. These policies and algorithms were followed during the patient's care in the ED.     Pt presents with SOB.  This is most likely secondary to COPD PNA-will get xray to evaluation Anemia-CBC to evaluate ACS- will get trops Arrhythmia-Will get EKG and keep on monitor.  COVID- will get testing per algorithm. PE-lower suspicion given no risk factors and other cause more likely   Chest x-ray shows emphysema.  No pneumonia noted.  Labs are reassuring no white count elevation.  We will give patient duo nebs and breathing treatment test for coronavirus.  Will do an ambulatory saturation anticipate discharge home  Reevaluated  patient and wheezing is much improved.  Patient ambulates with normal saturations.  Even with the translator it was difficult to understand patient if he fully understood that he already had steroids prescribed to him this morning.  He was not clear where they were sent and I cannot tell based upon the note.  I am nervous that he will not pick these ones up therefore I will represcribe him the steroids and albuterol.  Patient states that he does have someone who speaks English at home and so I told him that he should go over the discharge instructions with them and I would clearly write this out for him.  I discussed the provisional nature of ED diagnosis, the treatment so far, the ongoing plan of care, follow up appointments and return precautions with the patient and any family or support people present. They expressed understanding and agreed with the plan, discharged home.    ____________________________________________   FINAL CLINICAL IMPRESSION(S) / ED DIAGNOSES   Final diagnoses:  COPD exacerbation ([place])     MEDICATIONS GIVEN DURING THIS VISIT:  Medications  ipratropium-albuterol (DUONEB) 0.5-2.5 (3) MG/3ML nebulizer solution 3 mL (3 mLs Nebulization Given 07/09/19 1325)  ipratropium-albuterol (DUONEB) 0.5-2.5 (3) MG/3ML nebulizer solution 3 mL (3 mLs Nebulization Given 07/09/19 1325)  ipratropium-albuterol (DUONEB) 0.5-2.5 (3) MG/3ML nebulizer solution 3 mL (3 mLs Nebulization Given 07/09/19 1325)  methylPREDNISolone sodium succinate (SOLU-MEDROL) 125 mg/2 mL injection 125 mg (125 mg Intravenous Given 07/09/19 1323)     ED Discharge Orders         Ordered    predniSONE (DELTASONE) 20 MG tablet  Daily     07/09/19 1502    albuterol (VENTOLIN HFA) 108 (90 Base) MCG/ACT inhaler  Every 6 hours PRN     07/09/19 1502    azithromycin (ZITHROMAX Z-PAK) 250 MG tablet     07/09/19 1502    benzonatate (TESSALON PERLES) 100 MG capsule  3 times daily PRN     07/09/19 1502            Note:  This document was prepared using Dragon voice recognition software and may include unintentional dictation errors.   Vanessa Ruthven, MD 07/09/19 [phone]

## 2019-07-09 NOTE — Progress Notes (Signed)
Virtual Visit via Telephone The purpose of this virtual visit is to provide medical care while limiting exposure to the novel coronavirus (COVID19) for both patient and office staff.  Consent was obtained for phone visit:  Yes.   Answered questions that patient had about telehealth interaction:  Yes.   I discussed the limitations, risks, security and privacy concerns of performing an evaluation and management service by telephone. I also discussed with the patient that there may be a patient responsible charge related to this service. The patient expressed understanding and agreed to proceed.  Patient Location: Home Provider Location: [facility])  Patient presents for a same day appointment.  Daughter Jaque Bahn provides interpretation   ---------------------------------------------------------------------- Chief Complaint  Patient presents with  . Cough    SOB, dry cough, weakness and fatigue. x 10 days. He reports that he have to use the albuterol inhaler more frequent to get more air. He also feels like he have to go outside so he can breath better.. Coughing worsen while lying down. His daughter reports that he's making wheezing sounds while breathing.      S: Reviewed CMA documentation. I have called patient and gathered additional HPI as follows:  SUSPECTED COVID19 DYSPNEA / COUGH / FATIGUE WEAKNESS  Reports symptoms onset x 10 days ago with dry cough and wheezing, and worsening with weakness fatigue. He has history of possible COPD as a smoker and chronic bronchitis. He is on Spiriva daily and Albuterol PRN. He has used albuterol frequently lately with some temporary relief but problem is worsening. He has audible wheezing by report. He has sick contact with family member in past who was in hospital for respiratory distress, he has not had COVID19 testing.  Denies any fevers, chills, sweats, body ache, sinus pain or pressure, headache, abdominal pain,  diarrhea  Past Medical History:  Diagnosis Date  . Hemorrhoid    Social History   Tobacco Use  . Smoking status: Current Every Day Smoker    Packs/day: 1.50    Years: 30.00    Pack years: 45.00    Types: Cigarettes  . Smokeless tobacco: Never Used  Substance Use Topics  . Alcohol use: Yes    Comment: "sometimes"  . Drug use: No     -------------------------------------------------------------------------- O: No physical exam performed due to remote telephone encounter.  -------------------------------------------------------------------------- A&P:  SUSPECTED 0000000 Infection Complicated by chronic bronchitis/COPD - presumed exacerbation of COPD Worsening dyspnea / weakness / wheezing  Afebrile based on report, but no measured recent temp.  1. Start Prednisone 50mg  daily x 5 days 2. Keep using current Albuterol 2 puffs q 4-6 hr PRN for dyspnea 3. Continue Spiriva daily  SCHEDULED w/ [facility] here at [facility] tonight at 5:45pm to be seen face to face for COVID19 test, oxygen testing, possible Chest X-ray and may warrant IM steroid dose if needed for wheezing. Also discussed that IF any worsening between now and that apt tonight he can be seen at [facility] ED if needed due to breathing concerns. - If triaged in respiratory clinic and meets criteria to go straight to hospital, also advised them that he may be sent to hospital tonight if worsening or other clinical concerns on his evaluation.  Meds ordered this encounter  Medications  . predniSONE (DELTASONE) 50 MG tablet    Sig: Take 1 tablet (50 mg total) by mouth daily with breakfast.    Dispense:  5 tablet    Refill:  0  REQUIRED self quarantine to [place] - advised to avoid all exposure with others while during TESTING (Pending result) and treatment. Should continue to quarantine for up to 7-14 days - pending resolution of symptoms, if TEST IS NEGATIVE and symptoms resolve by 7  days and is afebrile >3 days - may STOP self quarantine at that time. IF test is POSITIVE then will require 10 additional day quarantine after date of positive test result.  If symptoms do not resolve or significantly improve OR if WORSENING - fever / cough - or worsening shortness of breath - then should contact us and seek advice on next steps in treatment at home vs where/when to seek care at Urgent Care or Hospital ED for further intervention and possible testing if indicated.  Patient verbalizes understanding with the above medical recommendations including the limitation of remote medical advice.  Specific follow-up / call-back criteria were given for patient to follow-up or seek medical care more urgently if needed.   - Time spent in direct consultation with patient on phone: 9 minutes  Nobie Putnam, [facility] 07/09/2019, 9:41 AM

## 2019-07-09 NOTE — ED Notes (Signed)
Family reports pt has hx COPD and is a smoker

## 2019-07-09 NOTE — ED Notes (Signed)
Pt ambulatory saturation maintained 94% on room air. MD [facility] aware. Pt only c/o cough after ambulation.

## 2019-07-09 NOTE — ED Notes (Signed)
E-signature not working at this time. Pt verbalized understanding of D/C instructions, prescriptions and follow up care with no further questions at this time. Pt in NAD and ambulatory at time of D/C.  

## 2019-07-09 NOTE — ED Triage Notes (Signed)
Pt arrives from home for [facility] and cough x 10 days, reports worsening weakness and fatigue. Family member states PCP advised ER visit. Reports he has not been around anyone that has been sick and has not been tested for covid. Reports chest pain, NAD noted at this time. PT able to speak in complete sentences without difficulty

## 2019-07-09 NOTE — Discharge Instructions (Addendum)
This is most likely secondary to COPD.  You need to cut down her smoking.  You should go to the pharmacy and pick up prednisone, azithromycin, Tessalon Perles to help with coughing and your albuterol inhaler.  You should use your albuterol inhaler every 4 hours.  The telemetry doc also prescribed you prednisone so make sure you not actually take both doses.  I want to make sure that all my prescriptions were in one place for you.  Your coronavirus test was negative.  Return to the ER if you develop worsening shortness of breath

## 2019-09-19 NOTE — Telephone Encounter (Signed)
error 

## 2019-10-04 ENCOUNTER — Other Ambulatory Visit: Payer: Self-pay | Admitting: Nurse Practitioner

## 2019-10-04 DIAGNOSIS — J410 Simple chronic bronchitis: Secondary | ICD-10-CM

## 2019-10-23 ENCOUNTER — Ambulatory Visit (INDEPENDENT_AMBULATORY_CARE_PROVIDER_SITE_OTHER): Payer: Commercial Managed Care - PPO | Admitting: Family Medicine

## 2019-10-23 ENCOUNTER — Other Ambulatory Visit: Payer: Self-pay

## 2019-10-23 ENCOUNTER — Encounter: Payer: Self-pay | Admitting: Family Medicine

## 2019-10-23 VITALS — BP 131/85 | HR 75 | Temp 97.7°F | Ht 62.0 in | Wt 135.6 lb

## 2019-10-23 DIAGNOSIS — S46011A Strain of muscle(s) and tendon(s) of the rotator cuff of right shoulder, initial encounter: Secondary | ICD-10-CM

## 2019-10-23 DIAGNOSIS — M25521 Pain in right elbow: Secondary | ICD-10-CM | POA: Diagnosis not present

## 2019-10-23 DIAGNOSIS — M19021 Primary osteoarthritis, right elbow: Secondary | ICD-10-CM

## 2019-10-23 DIAGNOSIS — J410 Simple chronic bronchitis: Secondary | ICD-10-CM

## 2019-10-23 DIAGNOSIS — F17210 Nicotine dependence, cigarettes, uncomplicated: Secondary | ICD-10-CM

## 2019-10-23 DIAGNOSIS — M19022 Primary osteoarthritis, left elbow: Secondary | ICD-10-CM

## 2019-10-23 DIAGNOSIS — M7661 Achilles tendinitis, right leg: Secondary | ICD-10-CM | POA: Diagnosis not present

## 2019-10-23 DIAGNOSIS — R21 Rash and other nonspecific skin eruption: Secondary | ICD-10-CM | POA: Insufficient documentation

## 2019-10-23 DIAGNOSIS — Z122 Encounter for screening for malignant neoplasm of respiratory organs: Secondary | ICD-10-CM | POA: Insufficient documentation

## 2019-10-23 DIAGNOSIS — M19029 Primary osteoarthritis, unspecified elbow: Secondary | ICD-10-CM | POA: Insufficient documentation

## 2019-10-23 DIAGNOSIS — M25522 Pain in left elbow: Secondary | ICD-10-CM

## 2019-10-23 LAB — POCT URINALYSIS DIPSTICK
Bilirubin, UA: NEGATIVE
Blood, UA: NEGATIVE
Glucose, UA: NEGATIVE
Ketones, UA: NEGATIVE
Leukocytes, UA: NEGATIVE
Nitrite, UA: NEGATIVE
Protein, UA: NEGATIVE
Spec Grav, UA: 1.02 (ref 1.010–1.025)
Urobilinogen, UA: 0.2 E.U./dL
pH, UA: 5 (ref 5.0–8.0)

## 2019-10-23 MED ORDER — HYDROCORTISONE 0.5 % EX CREA: 1.0000 "application " | TOPICAL_CREAM | Freq: Two times a day (BID) | CUTANEOUS | 0 refills | Status: DC

## 2019-10-23 MED ORDER — DICLOFENAC SODIUM 1 % EX GEL: 2.0000 g | Freq: Four times a day (QID) | CUTANEOUS | 1 refills | Status: DC

## 2019-10-23 MED ORDER — ALBUTEROL SULFATE HFA 108 (90 BASE) MCG/ACT IN AERS: 1.0000 | INHALATION_SPRAY | Freq: Four times a day (QID) | RESPIRATORY_TRACT | 2 refills | Status: DC | PRN

## 2019-10-23 MED ORDER — DICLOFENAC SODIUM 75 MG PO TBEC: 75.0000 mg | DELAYED_RELEASE_TABLET | Freq: Two times a day (BID) | ORAL | 0 refills | Status: DC

## 2019-10-23 MED ORDER — SPIRIVA HANDIHALER 18 MCG IN CAPS: ORAL_CAPSULE | RESPIRATORY_TRACT | 3 refills | Status: DC

## 2019-10-23 NOTE — Patient Instructions (Signed)
Your medication refills have been sent to your pharmacy on file.  I have sent in a prescription for Diclofenac tablets to take as directed for the arthritis pain as well as a topical voltaren gel that can be applied to the elbows 3-4x per day as needed for pain.  I have sent in a hydrocortisone cream for the itching of the back of the right hand and the left upper arm.    We will plan to see you back as needed for this and again in 3 months for a follow up on your chronic bronchitis  You will receive a survey after today's visit either digitally by e-mail or paper by [place] mail. Your experiences and feedback matter to [place].  Please respond so we know how we are doing as we provide care for you.  Call us with any questions/concerns/needs.  It is my goal to be available to you for your health concerns.  Thanks for choosing me to be a partner in your healthcare needs!  Harlin Rain, FNP-C Family Nurse Practitioner [facility] Phone: [phone]

## 2019-10-23 NOTE — Assessment & Plan Note (Addendum)
Rash noted to back of right hand and left upper arm.  No s/s of infection or infestation.  Some excoriation marks from scratching.  Nonspecific skin eruption, likely dry skin that has become irritated with scratching.  Discussed using topical hydrocortisone cream.  Plan: 1. Topical hydrocortisone cream to the area 2x per day as needed for itching. 2. Avoid scratching these areas 3. Can use a topical moisturizing lotion as needed

## 2019-10-23 NOTE — Assessment & Plan Note (Signed)
Has been having bilateral elbow pain, reporting some increased pain in right elbow.  Denies trauma/fall/injury.  Reports worsens with overuse.  No swelling or s/s of infection, popping, clicking, locking or decreased ROM or strength on exam.   Plan: 1. Can take diclofenac 75mg  twice daily for osteoarthritis pain 2. Can use topical voltaren gel 3-4x per day as needed for pain 3. To follow up as needed.

## 2019-10-23 NOTE — Assessment & Plan Note (Signed)
Patient's daughter with patient stating quit smoking 4 months ago but previously was smoking 3 PPD x 40 years.  Interested in LDCT testing.  Plan: 1. Referral to lung cancer screening placed.

## 2019-10-23 NOTE — Progress Notes (Signed)
Subjective:    Patient ID: Melvin Ramos, male    DOB: [birth year], 54 y.o.   MRN: [id number]  Melvin Ramos is a 54 y.o. male presenting on 10/23/2019 for Joint Swelling (bilateral elbow pain, but mostly the Rt elbow x 2 week. Constant aching elbow pain, bending the elbow pain makes it worse. ) and Pruritis (the pt requesting a lotion to appply for itching )   HPI  [name] presents to clinic for evaluation of bilateral elbow pain x 2 weeks and itchy skin.  Denies any trauma/fall/difficulty lifting/carrying, decreased grip strength, swelling, numbness/tingling/weakness, sleep interference.  Reports increased pain with increased repetitive movement.    Reports concerns for itching that have been going on/off for a few days.  Area of concern is top of right hand and upper left arm.  States itching is worse during the day, does not itch at night.  Denies fevers, swelling, drainage from where he has been scratching, anyone at home with similar symptoms, or any previous treatments for this.  Depression screen [facility] 2/9 06/20/2018 03/15/2018  Decreased Interest 0 0  Down, Depressed, Hopeless 0 0  PHQ - 2 Score 0 0    Social History   Tobacco Use  . Smoking status: Former Smoker    Packs/day: 1.50    Years: 30.00    Pack years: 45.00    Types: Cigarettes    Quit date: 07/09/2019    Years since quitting: 0.2  . Smokeless tobacco: Never Used  Substance Use Topics  . Alcohol use: Yes    Comment: "sometimes"  . Drug use: No    Review of Systems  Constitutional: Negative.   HENT: Negative.   Eyes: Negative.   Respiratory: Negative.   Cardiovascular: Negative.   Gastrointestinal: Negative.   Endocrine: Negative.   Genitourinary: Negative.   Musculoskeletal: Positive for arthralgias. Negative for back pain, gait problem, joint swelling, myalgias, neck pain and neck stiffness.  Skin: Positive for rash. Negative for color change, pallor and wound.  Allergic/Immunologic: Negative.     Neurological: Negative.   Hematological: Negative.   Psychiatric/Behavioral: Negative.    Per HPI unless specifically indicated above     Objective:    BP 131/85 (BP Location: Right Arm, Patient Position: Sitting, Cuff Size: Normal)   Pulse 75   Temp 97.7 F (36.5 C) (Temporal)   Ht 5\' 2"  (1.575 m)   Wt 135 lb 9.6 oz (61.5 kg)   SpO2 100%   BMI 24.80 kg/m   Wt Readings from Last 3 Encounters:  10/23/19 135 lb 9.6 oz (61.5 kg)  07/09/19 140 lb (63.5 kg)  03/28/19 131 lb 3.2 oz (59.5 kg)    Physical Exam Vitals reviewed. Exam conducted with a chaperone present (Daughter, [name]).  Constitutional:      General: He is not in acute distress.    Appearance: Normal appearance. He is well-developed, well-groomed and normal weight. He is not ill-appearing or toxic-appearing.  HENT:     Head: Normocephalic.  Eyes:     General: Lids are normal.        Right eye: No discharge.        Left eye: No discharge.     Extraocular Movements: Extraocular movements intact.     Conjunctiva/sclera: Conjunctivae normal.     Pupils: Pupils are equal, round, and reactive to light.  Cardiovascular:     Rate and Rhythm: Normal rate and regular rhythm.     Pulses: Normal pulses.     Heart  sounds: Normal heart sounds. No murmur. No friction rub. No gallop.   Pulmonary:     Effort: Pulmonary effort is normal. No respiratory distress.     Breath sounds: Normal breath sounds.  Musculoskeletal:        General: Tenderness present. No swelling, deformity or signs of injury. Normal range of motion.  Skin:    General: Skin is warm and dry.     Capillary Refill: Capillary refill takes less than 2 seconds.       Neurological:     General: No focal deficit present.     Mental Status: He is alert and oriented to person, place, and time.     Cranial Nerves: No cranial nerve deficit.     Sensory: No sensory deficit.     Motor: No weakness.     Coordination: Coordination normal.     Gait: Gait normal.   Psychiatric:        Attention and Perception: Attention and perception normal.        Mood and Affect: Mood and affect normal.        Speech: Speech normal.        Behavior: Behavior normal. Behavior is cooperative.        Thought Content: Thought content normal.        Cognition and Memory: Cognition and memory normal.    Results for orders placed or performed in visit on 10/23/19  POCT Urinalysis Dipstick  Result Value Ref Range   Color, UA yellow    Clarity, UA clear    Glucose, UA Negative Negative   Bilirubin, UA negative    Ketones, UA negative    Spec Grav, UA 1.020 1.010 - 1.025   Blood, UA negative    pH, UA 5.0 5.0 - 8.0   Protein, UA Negative Negative   Urobilinogen, UA 0.2 0.2 or 1.0 E.U./dL   Nitrite, UA negative    Leukocytes, UA Negative Negative   Appearance     Odor        Assessment & Plan:   Problem List Items Addressed This Visit      Respiratory   Simple chronic bronchitis (HCC)   Relevant Medications   tiotropium (SPIRIVA HANDIHALER) 18 MCG inhalation capsule   albuterol (VENTOLIN HFA) 108 (90 Base) MCG/ACT inhaler     Musculoskeletal and Integument   Osteoarthritis of elbow    Has been having bilateral elbow pain, reporting some increased pain in right elbow.  Denies trauma/fall/injury.  Reports worsens with overuse.  No swelling or s/s of infection, popping, clicking, locking or decreased ROM or strength on exam.   Plan: 1. Can take diclofenac 75mg  twice daily for osteoarthritis pain 2. Can use topical voltaren gel 3-4x per day as needed for pain 3. To follow up as needed.      Relevant Medications   diclofenac (VOLTAREN) 75 MG EC tablet   Rash and nonspecific skin eruption    Rash noted to back of right hand and left upper arm.  No s/s of infection or infestation.  Some excoriation marks from scratching.  Nonspecific skin eruption, likely dry skin that has become irritated with scratching.  Discussed using topical hydrocortisone  cream.  Plan: 1. Topical hydrocortisone cream to the area 2x per day as needed for itching. 2. Avoid scratching these areas 3. Can use a topical moisturizing lotion as needed        Other   Smoking greater than 30 pack years    Patient's  daughter with patient stating quit smoking 4 months ago but previously was smoking 3 PPD x 40 years.  Interested in LDCT testing.  Plan: 1. Referral to lung cancer screening placed.      Relevant Orders   Ambulatory Referral for Lung Cancer Scre   Encounter for screening for lung cancer   Relevant Orders   Ambulatory Referral for Lung Cancer Scre    Other Visit Diagnoses    Pain of both elbows    -  Primary   Relevant Medications   diclofenac Sodium (VOLTAREN) 1 % GEL   Routine medical exam       Relevant Orders   POCT Urinalysis Dipstick (Completed)   Strain of musc/tend the rotator cuff of right shoulder, init       Relevant Medications   diclofenac (VOLTAREN) 75 MG EC tablet   Achilles tendinitis of right lower extremity       Relevant Medications   diclofenac (VOLTAREN) 75 MG EC tablet   Rash       Relevant Medications   hydrocortisone cream 0.5 %      Meds ordered this encounter  Medications  . tiotropium (SPIRIVA HANDIHALER) 18 MCG inhalation capsule    Sig: INHALE 1 CAPSULE VIA HANDIHALER ONCE DAILY AT THE SAME TIME EVERY DAY    Dispense:  30 capsule    Refill:  3  . albuterol (VENTOLIN HFA) 108 (90 Base) MCG/ACT inhaler    Sig: Inhale 1-2 puffs into the lungs every 6 (six) hours as needed for wheezing or shortness of breath.    Dispense:  8.5 g    Refill:  2  . diclofenac (VOLTAREN) 75 MG EC tablet    Sig: Take 1 tablet (75 mg total) by mouth 2 (two) times daily.    Dispense:  30 tablet    Refill:  0  . diclofenac Sodium (VOLTAREN) 1 % GEL    Sig: Apply 2 g topically 4 (four) times daily.    Dispense:  50 g    Refill:  1  . hydrocortisone cream 0.5 %    Sig: Apply 1 application topically 2 (two) times daily.     Dispense:  30 g    Refill:  0      Follow up plan: Return if symptoms worsen or fail to improve.   Harlin Rain, [facility] Nurse Practitioner [facility] 10/23/2019, 1:39 PM

## 2019-12-11 ENCOUNTER — Telehealth: Payer: Self-pay | Admitting: *Deleted

## 2019-12-11 NOTE — Telephone Encounter (Signed)
Attempted to contact patient regarding lung screening referral. However, there is not answer or voicemail option.

## 2019-12-24 ENCOUNTER — Encounter: Payer: Self-pay | Admitting: Family Medicine

## 2019-12-24 ENCOUNTER — Other Ambulatory Visit: Payer: Self-pay

## 2019-12-24 ENCOUNTER — Ambulatory Visit (INDEPENDENT_AMBULATORY_CARE_PROVIDER_SITE_OTHER): Payer: Commercial Managed Care - PPO | Admitting: Family Medicine

## 2019-12-24 VITALS — BP 119/79 | HR 78 | Temp 97.5°F | Ht 62.0 in | Wt 135.0 lb

## 2019-12-24 DIAGNOSIS — L0291 Cutaneous abscess, unspecified: Secondary | ICD-10-CM

## 2019-12-24 DIAGNOSIS — L02212 Cutaneous abscess of back [any part, except buttock]: Secondary | ICD-10-CM | POA: Insufficient documentation

## 2019-12-24 DIAGNOSIS — M25522 Pain in left elbow: Secondary | ICD-10-CM

## 2019-12-24 DIAGNOSIS — B354 Tinea corporis: Secondary | ICD-10-CM | POA: Insufficient documentation

## 2019-12-24 DIAGNOSIS — M25521 Pain in right elbow: Secondary | ICD-10-CM | POA: Diagnosis not present

## 2019-12-24 MED ORDER — CLOTRIMAZOLE-BETAMETHASONE 1-0.05 % EX CREA: 1.0000 "application " | TOPICAL_CREAM | Freq: Two times a day (BID) | CUTANEOUS | 0 refills | Status: DC

## 2019-12-24 MED ORDER — SULFAMETHOXAZOLE-TRIMETHOPRIM 800-160 MG PO TABS: 1.0000 | ORAL_TABLET | Freq: Two times a day (BID) | ORAL | 0 refills | Status: AC

## 2019-12-24 NOTE — Assessment & Plan Note (Signed)
Bilateral elbow pain without trauma, injury, fall or overuse.  Exam negative in clinic.  Melvin Ramos reports patient has some hip and knee pain as well.  Unknown family history for RA.  Will have labs drawn for evaluation.  Plan: 1. Have labs drawn for evaluation 2. Can take diclofenac 75mg  BID PRN for elbow discomfort 3. RTC in 4 weeks for re-evaluation

## 2019-12-24 NOTE — Progress Notes (Signed)
Subjective:    Patient ID: Melvin Ramos, male    DOB: December 21, 1965, 54 y.o.   MRN: [id number]  Melvin Ramos is a 54 y.o. male presenting on 12/24/2019 for Cyst (2 cm cyst in the center of his back x 3 days ), Elbow Pain ( bilateral elbow pain x 2 mths Left thigh pain that cause muscle contractions. ), and Rash (red blotchy rash all over )   HPI  Melvin Ramos presents to clinic for evaluation of bilateral elbow pain x 2 months.  Has been taking diclofenac tablets and using topical diclofenac gel with minimal change in symptoms.  Requesting labs for evaluation.  Denies any numbness, tingling, weakness, loss of ROM, decrease in strength, sleep disturbance.  Has concerns for rash on bilateral lower legs.  Reports is very red, itchy, and "blotchy".  States has been there for a week or so.  Has concerns for lump on his back that has been present for 3 days.  Reports has pain when he lays on this area.  Denies fevers, drainage from this area, sleep disturbances, abdominal pain, n/v/d.  Depression screen [facility] 2/9 06/20/2018 03/15/2018  Decreased Interest 0 0  Down, Depressed, Hopeless 0 0  PHQ - 2 Score 0 0    Social History   Tobacco Use  . Smoking status: Former Smoker    Packs/day: 1.50    Years: 30.00    Pack years: 45.00    Types: Cigarettes    Quit date: 07/09/2019    Years since quitting: 0.4  . Smokeless tobacco: Never Used  Vaping Use  . Vaping Use: Never used  Substance Use Topics  . Alcohol use: Yes    Comment: "sometimes"  . Drug use: No    Review of Systems  Constitutional: Negative.   HENT: Negative.   Eyes: Negative.   Respiratory: Negative.   Cardiovascular: Negative.   Gastrointestinal: Negative.   Endocrine: Negative.   Genitourinary: Negative.   Musculoskeletal: Positive for arthralgias. Negative for back pain, gait problem, joint swelling, myalgias, neck pain and neck stiffness.  Skin: Positive for rash and wound. Negative for color change and pallor.    Allergic/Immunologic: Negative.   Neurological: Negative.   Hematological: Negative.   Psychiatric/Behavioral: Negative.    Per HPI unless specifically indicated above     Objective:    BP 119/79 (BP Location: Right Arm, Patient Position: Sitting, Cuff Size: Normal)   Pulse 78   Temp (!) 97.5 F (36.4 C) (Temporal)   Ht 5\' 2"  (1.575 m)   Wt 135 lb (61.2 kg)   BMI 24.69 kg/m   Wt Readings from Last 3 Encounters:  12/24/19 135 lb (61.2 kg)  10/23/19 135 lb 9.6 oz (61.5 kg)  07/09/19 140 lb (63.5 kg)    Physical Exam Vitals reviewed.  Constitutional:      General: He is not in acute distress.    Appearance: Normal appearance. He is well-developed, well-groomed and normal weight. He is not ill-appearing or toxic-appearing.  HENT:     Head: Normocephalic and atraumatic.     Nose:     Comments: [name] is in place, covering mouth and nose. Eyes:     General:        Right eye: No discharge.        Left eye: No discharge.     Extraocular Movements: Extraocular movements intact.     Conjunctiva/sclera: Conjunctivae normal.     Pupils: Pupils are equal, round, and reactive to light.  Cardiovascular:  Rate and Rhythm: Normal rate and regular rhythm.     Pulses: Normal pulses.     Heart sounds: Normal heart sounds. No murmur heard.  No friction rub. No gallop.   Pulmonary:     Effort: Pulmonary effort is normal. No respiratory distress.     Breath sounds: Normal breath sounds.  Musculoskeletal:        General: Tenderness present. No swelling.     Right upper arm: Normal.     Left upper arm: Normal.     Right elbow: No swelling, deformity, effusion or lacerations. Normal range of motion. Tenderness present in medial epicondyle and lateral epicondyle.     Left elbow: No swelling, deformity, effusion or lacerations. Normal range of motion. Tenderness present in medial epicondyle and lateral epicondyle.     Right forearm: Normal.     Left forearm: Normal.     Right wrist:  Normal.     Left wrist: Normal.     Right lower leg: No edema.     Left lower leg: No edema.     Comments: 5/5 strength BUE & BLE.  Normal tone  Skin:    General: Skin is warm and dry.     Capillary Refill: Capillary refill takes less than 2 seconds.          Comments: Circular lesions with central clearing bilateral lower extremities with multiple satellite lesions.  Yeast like in appearance  Abscess, as noted above, with sebaceous cyst.  Neurological:     General: No focal deficit present.     Mental Status: He is alert and oriented to person, place, and time.  Psychiatric:        Attention and Perception: Attention and perception normal.        Mood and Affect: Mood and affect normal.        Speech: Speech normal.        Behavior: Behavior normal. Behavior is cooperative.        Thought Content: Thought content normal.        Cognition and Memory: Cognition and memory normal.    _____________________________________________________________________ PROCEDURE NOTE Date: 12/24/2019 Incision and Drainage of 1 abscess Verbal consent given by patient. Time out taken. Area cleaned with alcohol swabs. Local anesthesia with lidocaine 1% 2cc injected into site and surrounding tissue. Area prepped with betadine swabs x 3. Scalpel 11 blade used to make approx 1 inch incision along center of mass, immediate drainage of liquid pus material and then further expression of thicker purulent sebaceous material with foul smell manually expressed from site. Hemostats as blunt dissection used to break up any loculations.  Area was cleaned, gauze and loose bandage applied. No complications. EBL < 5 cc.  Results for orders placed or performed in visit on 10/23/19  POCT Urinalysis Dipstick  Result Value Ref Range   Color, UA yellow    Clarity, UA clear    Glucose, UA Negative Negative   Bilirubin, UA negative    Ketones, UA negative    Spec Grav, UA 1.020 1.010 - 1.025   Blood, UA negative     pH, UA 5.0 5.0 - 8.0   Protein, UA Negative Negative   Urobilinogen, UA 0.2 0.2 or 1.0 E.U./dL   Nitrite, UA negative    Leukocytes, UA Negative Negative   Appearance     Odor        Assessment & Plan:   Problem List Items Addressed This Visit      Musculoskeletal  and Integument   Tinea corporis    Bilateral lower legs with circular lesions with central clearing, yeast like in appearance.  Will treat with topical clotrimazole/betamethasone ointment, applying 2x per day.  Discussed to wash hands well before and after treatment to avoid spreading of rash.  Plan: 1. Begin clotrimazole/betamethasone ointment BID to skin rash 2. RTC in 4 weeks      Relevant Medications   sulfamethoxazole-trimethoprim (BACTRIM DS) 800-160 MG tablet   clotrimazole-betamethasone (LOTRISONE) cream     Other   Abscess of back    Abscess of mid back, left paraspinal.  Area with abscess and sebaceous cyst.  Cleaned with iodine, injected with 71mL of lidocaine.  Opened in clinic with an 11 blade and abscess drained.  Patient tolerated procedure well.  Neurovascularly intact.  Discussed will cover with Bactrim DS, to take 1 tablet 2x per day for the next 5 days.  Discussed cyst may return and if so, would need to be removed by dermatology, ensuring that the sac has been completely removed.  Plan: 1. Begin bactrim DS 1 tablet 2x per day for the next 5 days      Bilateral elbow joint pain    Bilateral elbow pain without trauma, injury, fall or overuse.  Exam negative in clinic.  [place] reports patient has some hip and knee pain as well.  Unknown family history for RA.  Will have labs drawn for evaluation.  Plan: 1. Have labs drawn for evaluation 2. Can take diclofenac 75mg  BID PRN for elbow discomfort 3. RTC in 4 weeks for re-evaluation       Other Visit Diagnoses    Abscess    -  Primary   Relevant Medications   sulfamethoxazole-trimethoprim (BACTRIM DS) 800-160 MG tablet   lidocaine (PF) (XYLOCAINE) 1 %  injection 20 mg (Completed)   Other Relevant Orders   CBC with Differential   COMPLETE METABOLIC PANEL WITH GFR   Arthralgia of both elbows       Relevant Orders   Antinuclear Antib (ANA)   Rheumatoid factor   Cyclic citrul peptide antibody, IgG      Meds ordered this encounter  Medications  . sulfamethoxazole-trimethoprim (BACTRIM DS) 800-160 MG tablet    Sig: Take 1 tablet by mouth 2 (two) times daily for 5 days.    Dispense:  10 tablet    Refill:  0  . clotrimazole-betamethasone (LOTRISONE) cream    Sig: Apply 1 application topically 2 (two) times daily.    Dispense:  30 g    Refill:  0  . lidocaine (PF) (XYLOCAINE) 1 % injection 20 mg      Follow up plan: Return in about 4 weeks (around 01/21/2020) for Joint pain follow up visit.   Harlin Rain, [place] [facility] Nurse Practitioner [facility] 12/24/2019, 4:32 PM

## 2019-12-24 NOTE — Assessment & Plan Note (Signed)
Bilateral lower legs with circular lesions with central clearing, yeast like in appearance.  Will treat with topical clotrimazole/betamethasone ointment, applying 2x per day.  Discussed to wash hands well before and after treatment to avoid spreading of rash.  Plan: 1. Begin clotrimazole/betamethasone ointment BID to skin rash 2. RTC in 4 weeks

## 2019-12-24 NOTE — Assessment & Plan Note (Signed)
Abscess of mid back, left paraspinal.  Area with abscess and sebaceous cyst.  Cleaned with iodine, injected with 76mL of lidocaine.  Opened in clinic with an 11 blade and abscess drained.  Patient tolerated procedure well.  Neurovascularly intact.  Discussed will cover with Bactrim DS, to take 1 tablet 2x per day for the next 5 days.  Discussed cyst may return and if so, would need to be removed by dermatology, ensuring that the sac has been completely removed.  Plan: 1. Begin bactrim DS 1 tablet 2x per day for the next 5 days

## 2019-12-24 NOTE — Patient Instructions (Addendum)
Have your labs drawn in the next week and we will contact you with the results.  I have sent in a prescription for Bactrim DS to take 1 tablet 2x per day for the next 5 days.  If you begin to have any fevers or worsening drainage to contact our office for a sooner follow up visit.  I have sent in a prescription for clotrimazole-betamethasone to use on the fungal rash on legs.  We will plan to see you back in 4 weeks for joint pain re-evaluation  You will receive a survey after today's visit either digitally by e-mail or paper by [place] mail. Your experiences and feedback matter to [place].  Please respond so we know how we are doing as we provide care for you.  Call us with any questions/concerns/needs.  It is my goal to be available to you for your health concerns.  Thanks for choosing me to be a partner in your healthcare needs!  Harlin Rain, FNP-C Family Nurse Practitioner [facility] Phone: [phone]

## 2019-12-25 MED ORDER — LIDOCAINE HCL (PF) 1 % IJ SOLN
20.0000 mg | Freq: Once | INTRAMUSCULAR | Status: AC
  Administered 2019-12-25: 20 mg via INTRADERMAL

## 2019-12-29 LAB — CBC WITH DIFFERENTIAL/PLATELET
Absolute Monocytes: 407 cells/uL (ref 200–950)
Basophils Absolute: 30 cells/uL (ref 0–200)
Basophils Relative: 0.5 %
Eosinophils Absolute: 330 cells/uL (ref 15–500)
Eosinophils Relative: 5.6 %
HCT: 42.2 % (ref 38.5–50.0)
Hemoglobin: 14.1 g/dL (ref 13.2–17.1)
Lymphs Abs: 2059 cells/uL (ref 850–3900)
MCH: 31.5 pg (ref 27.0–33.0)
MCHC: 33.4 g/dL (ref 32.0–36.0)
MCV: 94.4 fL (ref 80.0–100.0)
MPV: 10.7 fL (ref 7.5–12.5)
Monocytes Relative: 6.9 %
Neutro Abs: 3074 cells/uL (ref 1500–7800)
Neutrophils Relative %: 52.1 %
Platelets: 202 10*3/uL (ref 140–400)
RBC: 4.47 10*6/uL (ref 4.20–5.80)
RDW: 12.8 % (ref 11.0–15.0)
Total Lymphocyte: 34.9 %
WBC: 5.9 10*3/uL (ref 3.8–10.8)

## 2019-12-29 LAB — COMPLETE METABOLIC PANEL WITH GFR
AG Ratio: 1.7 (calc) (ref 1.0–2.5)
ALT: 23 U/L (ref 9–46)
AST: 18 U/L (ref 10–35)
Albumin: 4.2 g/dL (ref 3.6–5.1)
Alkaline phosphatase (APISO): 52 U/L (ref 35–144)
BUN: 17 mg/dL (ref 7–25)
CO2: 26 mmol/L (ref 20–32)
Calcium: 9.2 mg/dL (ref 8.6–10.3)
Chloride: 106 mmol/L (ref 98–110)
Creat: 0.79 mg/dL (ref 0.70–1.33)
GFR, Est African American: 117 mL/min/{1.73_m2} (ref >=60)
GFR, Est Non African American: 101 mL/min/{1.73_m2} (ref >=60)
Globulin: 2.5 g/dL (calc) (ref 1.9–3.7)
Glucose, Bld: 132 mg/dL — ABNORMAL HIGH (ref 65–99)
Potassium: 3.8 mmol/L (ref 3.5–5.3)
Sodium: 140 mmol/L (ref 135–146)
Total Bilirubin: 0.4 mg/dL (ref 0.2–1.2)
Total Protein: 6.7 g/dL (ref 6.1–8.1)

## 2019-12-29 LAB — ANA: Anti Nuclear Antibody (ANA): NEGATIVE

## 2019-12-29 LAB — HEMOGLOBIN A1C W/OUT EAG: Hgb A1c MFr Bld: 5.2 % of total Hgb (ref <5.7)

## 2019-12-29 LAB — CYCLIC CITRUL PEPTIDE ANTIBODY, IGG: Cyclic Citrullin Peptide Ab: <16 UNITS

## 2019-12-29 LAB — RHEUMATOID FACTOR: Rheumatoid fact SerPl-aCnc: <14 IU/mL (ref <14)

## 2020-02-21 ENCOUNTER — Telehealth: Payer: Self-pay

## 2020-02-21 DIAGNOSIS — Z87891 Personal history of nicotine dependence: Secondary | ICD-10-CM

## 2020-02-21 DIAGNOSIS — Z122 Encounter for screening for malignant neoplasm of respiratory organs: Secondary | ICD-10-CM

## 2020-02-21 NOTE — Telephone Encounter (Signed)
Contacted patient today for lung CT screening clinic after receiving referral from Cyndia Skeeters, [place].  Patient's voice mail was not set up to accept messages and I was unable to leave message.

## 2020-02-27 ENCOUNTER — Encounter: Payer: Self-pay | Admitting: *Deleted

## 2020-04-07 NOTE — Telephone Encounter (Signed)
Daughter, [name], returned call. Received referral for initial lung cancer screening scan. Contacted patient and obtained smoking history,(former, quit 07/09/19, 80 pack year) as well as answering questions related to screening process. Patient denies signs of lung cancer such as weight loss or hemoptysis. Patient denies comorbidity that would prevent curative treatment if lung cancer were found. Patient is scheduled for shared decision making visit and CT scan on 04/21/20 at 10am.

## 2020-04-07 NOTE — Addendum Note (Signed)
Addended by: Lieutenant Diego on: 04/07/2020 10:24 AM   Modules accepted: Orders

## 2020-04-15 ENCOUNTER — Other Ambulatory Visit: Payer: Self-pay

## 2020-04-15 ENCOUNTER — Encounter: Payer: Self-pay | Admitting: Family Medicine

## 2020-04-15 ENCOUNTER — Ambulatory Visit (INDEPENDENT_AMBULATORY_CARE_PROVIDER_SITE_OTHER): Payer: Commercial Managed Care - PPO | Admitting: Family Medicine

## 2020-04-15 VITALS — BP 116/77 | HR 81 | Temp 97.7°F | Ht 62.0 in | Wt 136.0 lb

## 2020-04-15 DIAGNOSIS — J441 Chronic obstructive pulmonary disease with (acute) exacerbation: Secondary | ICD-10-CM

## 2020-04-15 DIAGNOSIS — J410 Simple chronic bronchitis: Secondary | ICD-10-CM | POA: Diagnosis not present

## 2020-04-15 MED ORDER — LEVOFLOXACIN 500 MG PO TABS: 500.0000 mg | ORAL_TABLET | Freq: Every day | ORAL | 0 refills | Status: DC

## 2020-04-15 MED ORDER — PSEUDOEPH-BROMPHEN-DM 30-2-10 MG/5ML PO SYRP: 5.0000 mL | ORAL_SOLUTION | Freq: Four times a day (QID) | ORAL | 0 refills | Status: DC | PRN

## 2020-04-15 MED ORDER — PREDNISONE 20 MG PO TABS: ORAL_TABLET | ORAL | 0 refills | Status: DC

## 2020-04-15 MED ORDER — TRELEGY ELLIPTA 100-62.5-25 MCG/INH IN AEPB: 1.0000 | INHALATION_SPRAY | Freq: Every day | RESPIRATORY_TRACT | 0 refills | Status: DC

## 2020-04-15 NOTE — Patient Instructions (Addendum)
Thank you for coming to the office today.  Referral to Lung specialist [facility], stay tuned for apt, if they schedule you can make request of specific doctor.  Dr Claudette Stapler - if you can call to request this provider  Stop Spiriva - start Trelegy inhaler 1 puff daily.  Start Cough medicine syrup  Start Levaquin antibiotic 7 days  Start Prednisone 7 days  If not improving can consider hospital ED or urgent care.   Please schedule a Follow-up Appointment to: Return in about 1 week (around 04/22/2020), or if symptoms worsen or fail to improve, for COPD.  If you have any other questions or concerns, please feel free to call the office or send a message through [place]. You may also schedule an earlier appointment if necessary.  Additionally, you may be receiving a survey about your experience at our office within a few days to 1 week by e-mail or mail. We value your feedback.  Nobie Putnam, DO [place]

## 2020-04-15 NOTE — Progress Notes (Signed)
Subjective:    Patient ID: Melvin Ramos, male    DOB: 01/03/1966, 54 y.o.   MRN: [id number]  Melvin Ramos is a 54 y.o. male presenting on 04/15/2020 for COPD (SOB onset 4 days denies fever but has chronic cough --had used inhaler but can't breath as per patient )  Patient presents for a same day appointment.  PCP Cyndia Skeeters, FNP  Patient speaks [place], family is able to interpret today  HPI   Acute COPD, Centrilobular Emphysema vs Chronic Bronchitis Recent flare 4 days ago with coughing, causing soreness in R ribcage due to cough Admits increase productive cough sputum Last COPD exac 07/2019 ED visit He works 3rd shift. Difficulty sleeping during due to coughing Does not have nebulizer. He has albuterol PRN inhaler using this regularly. He has Spiriva 1 puff daily. Limited results Denies fever or chills or sweats, no sick contact or COVID exposure  Need up date on his last COVID and Flu vaccines. Not in system.   Depression screen [facility] 2/9 06/20/2018 03/15/2018  Decreased Interest 0 0  Down, Depressed, Hopeless 0 0  PHQ - 2 Score 0 0    Social History   Tobacco Use  . Smoking status: Former Smoker    Packs/day: 1.50    Years: 30.00    Pack years: 45.00    Types: Cigarettes    Quit date: 07/09/2019    Years since quitting: 0.7  . Smokeless tobacco: Never Used  Vaping Use  . Vaping Use: Never used  Substance Use Topics  . Alcohol use: Yes    Comment: "sometimes"  . Drug use: No    Review of Systems Per HPI unless specifically indicated above     Objective:    BP 116/77   Pulse 81   Temp 97.7 F (36.5 C) (Temporal)   Ht 5\' 2"  (1.575 m)   Wt 136 lb (61.7 kg)   SpO2 96%   BMI 24.87 kg/m   Wt Readings from Last 3 Encounters:  04/15/20 136 lb (61.7 kg)  12/24/19 135 lb (61.2 kg)  10/23/19 135 lb 9.6 oz (61.5 kg)    Physical Exam Vitals and nursing note reviewed.  Constitutional:      General: He is not in acute distress.    Appearance: He is  well-developed. He is not diaphoretic.     Comments: Well-appearing, comfortable, cooperative  HENT:     Head: Normocephalic and atraumatic.  Eyes:     General:        Right eye: No discharge.        Left eye: No discharge.     Conjunctiva/sclera: Conjunctivae normal.  Cardiovascular:     Rate and Rhythm: Normal rate.  Pulmonary:     Effort: Pulmonary effort is normal.     Breath sounds: Wheezing (diffuse exp wheeze) present. No rhonchi or rales.     Comments: cough Musculoskeletal:     Comments: Sore R rib cage  Skin:    General: Skin is warm and dry.     Findings: No erythema or rash.  Neurological:     Mental Status: He is alert and oriented to person, place, and time.  Psychiatric:        Behavior: Behavior normal.     Comments: Well groomed, good eye contact, normal speech and thoughts      I have personally reviewed the radiology report from 07/09/2019 CXR.  CLINICAL DATA:  Shortness of breath and cough for 10 days.  EXAM:  CHEST - 2 VIEW  COMPARISON:  PA and lateral chest 03/23/2017.  FINDINGS: The chest is hyperexpanded with attenuation of the pulmonary vasculature. Lungs are clear. No pneumothorax or pleural effusion. Heart size is normal. No acute or focal bony abnormality.  IMPRESSION: Emphysema without acute disease.   Electronically Signed   By: Inge Rise M.D.   On: 07/09/2019 12:01  Results for orders placed or performed in visit on 12/24/19  CBC with Differential  Result Value Ref Range   WBC 5.9 3.8 - 10.8 Thousand/uL   RBC 4.47 4.20 - 5.80 Million/uL   Hemoglobin 14.1 13.2 - 17.1 g/dL   HCT 42.2 38 - 50 %   MCV 94.4 80.0 - 100.0 fL   MCH 31.5 27.0 - 33.0 pg   MCHC 33.4 32.0 - 36.0 g/dL   RDW 12.8 11.0 - 15.0 %   Platelets 202 140 - 400 Thousand/uL   MPV 10.7 7.5 - 12.5 fL   Neutro Abs 3,074 1,500 - 7,800 cells/uL   Lymphs Abs 2,059 850 - 3,900 cells/uL   Absolute Monocytes 407 200 - 950 cells/uL   Eosinophils Absolute 330  15.0 - 500.0 cells/uL   Basophils Absolute 30 0.0 - 200.0 cells/uL   Neutrophils Relative % 52.1 %   Total Lymphocyte 34.9 %   Monocytes Relative 6.9 %   Eosinophils Relative 5.6 %   Basophils Relative 0.5 %  COMPLETE METABOLIC PANEL WITH GFR  Result Value Ref Range   Glucose, Bld 132 (H) 65 - 99 mg/dL   BUN 17 7 - 25 mg/dL   Creat 0.79 0.70 - 1.33 mg/dL   GFR, Est Non African American 101 > OR = 60 mL/min/1.65m2   GFR, Est African American 117 > OR = 60 mL/min/1.35m2   BUN/Creatinine Ratio NOT APPLICABLE 6 - 22 (calc)   Sodium 140 135 - 146 mmol/L   Potassium 3.8 3.5 - 5.3 mmol/L   Chloride 106 98 - 110 mmol/L   CO2 26 20 - 32 mmol/L   Calcium 9.2 8.6 - 10.3 mg/dL   Total Protein 6.7 6.1 - 8.1 g/dL   Albumin 4.2 3.6 - 5.1 g/dL   Globulin 2.5 1.9 - 3.7 g/dL (calc)   AG Ratio 1.7 1.0 - 2.5 (calc)   Total Bilirubin 0.4 0.2 - 1.2 mg/dL   Alkaline phosphatase (APISO) 52 35 - 144 U/L   AST 18 10 - 35 U/L   ALT 23 9 - 46 U/L  Antinuclear Antib (ANA)  Result Value Ref Range   Anti Nuclear Antibody (ANA) NEGATIVE NEGATIVE  Rheumatoid factor  Result Value Ref Range   Rhuematoid fact SerPl-aCnc <16 <96 IU/mL  Cyclic citrul peptide antibody, IgG  Result Value Ref Range   Cyclic Citrullin Peptide Ab <16 UNITS  Hemoglobin A1C w/out eAG  Result Value Ref Range   Hgb A1c MFr Bld 5.2 <5.7 % of total Hgb      Assessment & Plan:   Problem List Items Addressed This Visit    Simple chronic bronchitis (HCC)   Relevant Medications   TRELEGY ELLIPTA 100-62.5-25 MCG/INH AEPB   Other Relevant Orders   Ambulatory referral to Pulmonology    Other Visit Diagnoses    COPD exacerbation ([place])    -  Primary   Relevant Medications   levofloxacin (LEVAQUIN) 500 MG tablet   brompheniramine-pseudoephedrine-DM 30-2-10 MG/5ML syrup   predniSONE (DELTASONE) 20 MG tablet   TRELEGY ELLIPTA 100-62.5-25 MCG/INH AEPB   Other Relevant Orders   Ambulatory referral  to Pulmonology      Acute COPD  exacerbation History of recurrent flares, with known chronic bronchitis / emphysema on last CXR 07/2019 No recent CT imaging or PFT available Pulse ox 96% on RA On Spiriva, seems sub optimal result given flares still.  Start taking Levaquin antibiotic 500mg  daily x 7 days Start Prednisone for 7 days taper Cough syrup rx STOP SPiriva START Trelegy sample can order new rx or can be done by Pulm if indicated  Referral to [facility] Dr Lanney Gins requested    Orders Placed This Encounter  Procedures  . Ambulatory referral to Pulmonology    Referral Priority:   Routine    Referral Type:   Consultation    Referral Reason:   Specialty Services Required    Requested Specialty:   Pulmonary Disease    Number of Visits Requested:   1     Meds ordered this encounter  Medications  . levofloxacin (LEVAQUIN) 500 MG tablet    Sig: Take 1 tablet (500 mg total) by mouth daily. For 7 days    Dispense:  7 tablet    Refill:  0  . brompheniramine-pseudoephedrine-DM 30-2-10 MG/5ML syrup    Sig: Take 5 mLs by mouth 4 (four) times daily as needed.    Dispense:  118 mL    Refill:  0  . predniSONE (DELTASONE) 20 MG tablet    Sig: Take daily with food. Start with 60mg  (3 pills) x 2 days, then reduce to 40mg  (2 pills) x 2 days, then 20mg  (1 pill) x 3 days    Dispense:  13 tablet    Refill:  0  . TRELEGY ELLIPTA 100-62.5-25 MCG/INH AEPB    Sig: Inhale 1 puff into the lungs daily.    Dispense:  1 each    Refill:  0     Follow up plan: Return in about 1 week (around 04/22/2020), or if symptoms worsen or fail to improve, for COPD.   Nobie Putnam, [facility] 04/15/2020, 2:18 PM

## 2020-04-21 ENCOUNTER — Ambulatory Visit
Admission: RE | Admit: 2020-04-21 | Discharge: 2020-04-21 | Disposition: A | Discharge status: Home / Self Care | Payer: Commercial Managed Care - PPO | Source: Ambulatory Visit | Attending: Nurse Practitioner | Admitting: Nurse Practitioner

## 2020-04-21 ENCOUNTER — Other Ambulatory Visit: Payer: Self-pay

## 2020-04-21 ENCOUNTER — Inpatient Hospital Stay: Payer: Commercial Managed Care - PPO | Attending: Nurse Practitioner | Admitting: Nurse Practitioner

## 2020-04-21 ENCOUNTER — Encounter: Payer: Self-pay | Admitting: Nurse Practitioner

## 2020-04-21 DIAGNOSIS — Z87891 Personal history of nicotine dependence: Secondary | ICD-10-CM | POA: Insufficient documentation

## 2020-04-21 DIAGNOSIS — Z122 Encounter for screening for malignant neoplasm of respiratory organs: Secondary | ICD-10-CM | POA: Diagnosis present

## 2020-04-21 NOTE — Progress Notes (Signed)
Virtual Visit via Video Enabled Telemedicine Note   I connected with Melvin Ramos on 04/21/20 at 10:40 AM EST by video enabled telemedicine visit and verified that I am speaking with the correct person using two identifiers. Due to language barrier, an interpreter was present and participated in all patient interactions.   I discussed the limitations, risks, security and privacy concerns of performing an evaluation and management service by telemedicine and the availability of in-person appointments. I also discussed with the patient that there may be a patient responsible charge related to this service. The patient expressed understanding and agreed to proceed.   Other persons participating in the visit and their role in the encounter: Burgess Estelle, RN- checking in patient & navigation  Patient's location: [place]  Provider's location: Clinic  Chief Complaint: Low Dose CT Screening  Patient agreed to evaluation by telemedicine to discuss shared decision making for consideration of low dose CT lung cancer screening.    In accordance with CMS guidelines, patient has met eligibility criteria including age, absence of signs or symptoms of lung cancer.  Social History   Tobacco Use  . Smoking status: Former Smoker    Packs/day: 2.00    Years: 40.00    Pack years: 80.00    Types: Cigarettes    Quit date: 07/09/2019    Years since quitting: 0.7  . Smokeless tobacco: Never Used  Substance Use Topics  . Alcohol use: Yes    Comment: "sometimes"     A shared decision-making session was conducted prior to the performance of CT scan. This includes one or more decision aids, includes benefits and harms of screening, follow-up diagnostic testing, over-diagnosis, false positive rate, and total radiation exposure.   Counseling on the importance of adherence to annual lung cancer LDCT screening, impact of co-morbidities, and ability or willingness to undergo diagnosis and treatment is  imperative for compliance of the program.   Counseling on the importance of continued smoking cessation for former smokers; the importance of smoking cessation for current smokers, and information about tobacco cessation interventions have been given to patient including [place] and [employer] [place] programs.   Written order for lung cancer screening with LDCT has been given to the patient and any and all questions have been answered to the best of my abilities.    Yearly follow up will be coordinated by Burgess Estelle, Thoracic Navigator.  I discussed the assessment and treatment plan with the patient. The patient was provided an opportunity to ask questions and all were answered. The patient agreed with the plan and demonstrated an understanding of the instructions.   The patient was advised to call back or seek an in-person evaluation if the symptoms worsen or if the condition fails to improve as anticipated.   I provided 15 minutes of face-to-face video visit time during this encounter, and > 50% was spent counseling as documented under my assessment & plan.   Beckey Rutter, DNP, AGNP-C [place] at [facility] [phone] (clinic)

## 2020-04-22 ENCOUNTER — Telehealth: Payer: Self-pay | Admitting: *Deleted

## 2020-04-22 NOTE — Telephone Encounter (Signed)
Notified patient of LDCT lung cancer screening program results with recommendation for 12 month follow up imaging. Also notified of incidental findings noted below and is encouraged to discuss further with PCP who will receive a copy of this note and/or the CT report. Patient verbalizes understanding.   IMPRESSION: 1. Lung-RADS 1, negative. Continue annual screening with low-dose chest CT without contrast in 12 months. 2.  Emphysema (ICD10-J43.9).

## 2020-07-01 ENCOUNTER — Other Ambulatory Visit: Payer: Self-pay

## 2020-07-01 ENCOUNTER — Encounter: Payer: Self-pay | Admitting: Family Medicine

## 2020-07-01 ENCOUNTER — Ambulatory Visit (INDEPENDENT_AMBULATORY_CARE_PROVIDER_SITE_OTHER): Payer: Commercial Managed Care - PPO | Admitting: Family Medicine

## 2020-07-01 VITALS — BP 137/94 | HR 74 | Temp 97.7°F | Resp 18 | Ht 62.0 in | Wt 137.4 lb

## 2020-07-01 DIAGNOSIS — J441 Chronic obstructive pulmonary disease with (acute) exacerbation: Secondary | ICD-10-CM | POA: Insufficient documentation

## 2020-07-01 MED ORDER — PSEUDOEPH-BROMPHEN-DM 30-2-10 MG/5ML PO SYRP: 5.0000 mL | ORAL_SOLUTION | Freq: Four times a day (QID) | ORAL | 0 refills | Status: DC | PRN

## 2020-07-01 MED ORDER — ALBUTEROL SULFATE HFA 108 (90 BASE) MCG/ACT IN AERS: 1.0000 | INHALATION_SPRAY | Freq: Four times a day (QID) | RESPIRATORY_TRACT | 2 refills | Status: AC | PRN

## 2020-07-01 MED ORDER — PREDNISONE 20 MG PO TABS: ORAL_TABLET | ORAL | 0 refills | Status: DC

## 2020-07-01 MED ORDER — DOXYCYCLINE HYCLATE 100 MG PO TABS: 100.0000 mg | ORAL_TABLET | Freq: Two times a day (BID) | ORAL | 0 refills | Status: DC

## 2020-07-01 NOTE — Patient Instructions (Signed)
As we discussed, you have a COPD exacerbation.  I have sent in a prednisone taper, to take as directed Doxycycline 100mg  to take 1 tablet 2x per day for the next 10 days Take the albuterol inhaler 1-2 puffs every 4-6 hours as needed for cough/shortness of breath I have sent in a refill on your cough medicine  Continue all other medications as directed  We have sent your COVID swab to the lab for processing.  If you begin to have worsening shortness of breath, chest pain, fever over 104 that is not responsive to ibuprofen and/or acetaminophen, or impending sense of doom to [place]!  We will plan to see you back if your symptoms worsen or fail to improve  You will receive a survey after today's visit either digitally by e-mail or paper by USPS mail. Your experiences and feedback matter to [place].  Please respond so we know how we are doing as we provide care for you.  Call us with any questions/concerns/needs.  It is my goal to be available to you for your health concerns.  Thanks for choosing me to be a partner in your healthcare needs!  Harlin Rain, FNP-C Family Nurse Practitioner [facility] Phone: [phone]

## 2020-07-01 NOTE — Progress Notes (Signed)
Subjective:    Patient ID: Melvin Ramos, male    DOB: [birth year], 55 y.o.   MRN: [id number]  Tramaine Ramos is a 55 y.o. male presenting on 07/01/2020 for COPD (Pt complains of persistent SOB , persistent coughing make the SOB worse x 10 days )   HPI  Melvin Ramos presents to clinic with his daughter today for concerns of persistent shortness of breath, with persistent cough that makes his shortness of breath worse x 10 days.  Has recently started to follow with pulmonology in December with [facility] and is taking his medications as directed.  Denies any fevers, sore throat, change in taste/smell, DOE, CP, abdominal pain, n/v/d.  Does have a contact that he has been around that has similar symptoms with COPD.  No known COVID exposure.    Depression screen [facility] 2/9 06/20/2018 03/15/2018  Decreased Interest 0 0  Down, Depressed, Hopeless 0 0  PHQ - 2 Score 0 0    Social History   Tobacco Use  . Smoking status: Former Smoker    Packs/day: 2.00    Years: 40.00    Pack years: 80.00    Types: Cigarettes    Quit date: 07/09/2019    Years since quitting: 0.9  . Smokeless tobacco: Never Used  Vaping Use  . Vaping Use: Never used  Substance Use Topics  . Alcohol use: Yes    Comment: "sometimes"  . Drug use: No    Review of Systems  Constitutional: Negative.   HENT: Negative.   Eyes: Negative.   Respiratory: Positive for cough, shortness of breath and wheezing. Negative for apnea, choking, chest tightness and stridor.   Cardiovascular: Negative.   Gastrointestinal: Negative.   Endocrine: Negative.   Genitourinary: Negative.   Musculoskeletal: Negative.   Skin: Negative.   Allergic/Immunologic: Negative.   Neurological: Negative.   Hematological: Negative.   Psychiatric/Behavioral: Negative.    Per HPI unless specifically indicated above     Objective:    BP (!) 137/94 (BP Location: Right Arm, Patient Position: Sitting, Cuff Size: Normal)   Pulse 74   Temp 97.7 F (36.5 C)  (Temporal)   Resp 18   Ht 5\' 2"  (1.575 m)   Wt 137 lb 6.4 oz (62.3 kg)   SpO2 98%   BMI 25.13 kg/m   Wt Readings from Last 3 Encounters:  07/01/20 137 lb 6.4 oz (62.3 kg)  04/21/20 136 lb (61.7 kg)  04/15/20 136 lb (61.7 kg)    Physical Exam Vitals and nursing note reviewed.  Constitutional:      General: He is not in acute distress.    Appearance: Normal appearance. He is well-developed and well-groomed. He is not ill-appearing or toxic-appearing.  HENT:     Head: Normocephalic and atraumatic.     Nose:     Comments: [name] is in place, covering mouth and nose. Eyes:     General:        Right eye: No discharge.        Left eye: No discharge.     Extraocular Movements: Extraocular movements intact.     Conjunctiva/sclera: Conjunctivae normal.     Pupils: Pupils are equal, round, and reactive to light.  Cardiovascular:     Rate and Rhythm: Normal rate and regular rhythm.     Pulses: Normal pulses.     Heart sounds: Normal heart sounds. No murmur heard. No friction rub. No gallop.   Pulmonary:     Effort: Pulmonary effort is normal. No  respiratory distress.     Breath sounds: Examination of the right-lower field reveals wheezing. Examination of the left-lower field reveals wheezing. Wheezing present.  Musculoskeletal:     Right lower leg: No edema.     Left lower leg: No edema.  Skin:    General: Skin is warm and dry.     Capillary Refill: Capillary refill takes less than 2 seconds.  Neurological:     General: No focal deficit present.     Mental Status: He is alert and oriented to person, place, and time.  Psychiatric:        Attention and Perception: Attention and perception normal.        Mood and Affect: Mood and affect normal.        Speech: Speech normal.        Behavior: Behavior normal. Behavior is cooperative.        Thought Content: Thought content normal.        Cognition and Memory: Cognition and memory normal.    Results for orders placed or  performed in visit on 12/24/19  CBC with Differential  Result Value Ref Range   WBC 5.9 3.8 - 10.8 Thousand/uL   RBC 4.47 4.20 - 5.80 Million/uL   Hemoglobin 14.1 13.2 - 17.1 g/dL   HCT 42.2 38.5 - 50.0 %   MCV 94.4 80.0 - 100.0 fL   MCH 31.5 27.0 - 33.0 pg   MCHC 33.4 32.0 - 36.0 g/dL   RDW 12.8 11.0 - 15.0 %   Platelets 202 140 - 400 Thousand/uL   MPV 10.7 7.5 - 12.5 fL   Neutro Abs 3,074 1,500 - 7,800 cells/uL   Lymphs Abs 2,059 850 - 3,900 cells/uL   Absolute Monocytes 407 200 - 950 cells/uL   Eosinophils Absolute 330 15 - 500 cells/uL   Basophils Absolute 30 0 - 200 cells/uL   Neutrophils Relative % 52.1 %   Total Lymphocyte 34.9 %   Monocytes Relative 6.9 %   Eosinophils Relative 5.6 %   Basophils Relative 0.5 %  COMPLETE METABOLIC PANEL WITH GFR  Result Value Ref Range   Glucose, Bld 132 (H) 65 - 99 mg/dL   BUN 17 7 - 25 mg/dL   Creat 0.79 0.70 - 1.33 mg/dL   GFR, Est Non African American 101 > OR = 60 mL/min/1.29m2   GFR, Est African American 117 > OR = 60 mL/min/1.109m2   BUN/Creatinine Ratio NOT APPLICABLE 6 - 22 (calc)   Sodium 140 135 - 146 mmol/L   Potassium 3.8 3.5 - 5.3 mmol/L   Chloride 106 98 - 110 mmol/L   CO2 26 20 - 32 mmol/L   Calcium 9.2 8.6 - 10.3 mg/dL   Total Protein 6.7 6.1 - 8.1 g/dL   Albumin 4.2 3.6 - 5.1 g/dL   Globulin 2.5 1.9 - 3.7 g/dL (calc)   AG Ratio 1.7 1.0 - 2.5 (calc)   Total Bilirubin 0.4 0.2 - 1.2 mg/dL   Alkaline phosphatase (APISO) 52 35 - 144 U/L   AST 18 10 - 35 U/L   ALT 23 9 - 46 U/L  Antinuclear Antib (ANA)  Result Value Ref Range   Anti Nuclear Antibody (ANA) NEGATIVE NEGATIVE  Rheumatoid factor  Result Value Ref Range   Rhuematoid fact SerPl-aCnc <09 <98 IU/mL  Cyclic citrul peptide antibody, IgG  Result Value Ref Range   Cyclic Citrullin Peptide Ab <16 UNITS  Hemoglobin A1C w/out eAG  Result Value Ref Range   Hgb  A1c MFr Bld 5.2 <5.7 % of total Hgb      Assessment & Plan:   Problem List Items Addressed This  Visit      Respiratory   COPD exacerbation ([place]) - Primary    COPD exacerbation x 10 days.  Will swab for COVID due to recent symptom onset.  Will treat with doxycycline 100mg  BID x 10 days, prednisone taper, albuterol HFA 1-2 puffs every 4-6 hours as needed for SOB/cough, and cough medicine to take as directed.  To keep upcoming follow up appointment with pulmonology and RTC PRN.      Relevant Medications   Budeson-Glycopyrrol-Formoterol (BREZTRI AEROSPHERE) 160-9-4.8 MCG/ACT AERO   predniSONE (DELTASONE) 20 MG tablet   doxycycline (VIBRA-TABS) 100 MG tablet   albuterol (VENTOLIN HFA) 108 (90 Base) MCG/ACT inhaler   brompheniramine-pseudoephedrine-DM 30-2-10 MG/5ML syrup   Other Relevant Orders   Novel Coronavirus, NAA ([facility])      Meds ordered this encounter  Medications  . predniSONE (DELTASONE) 20 MG tablet    Sig: Take daily with food. Start with 60mg  (3 pills) x 2 days, then reduce to 40mg  (2 pills) x 2 days, then 20mg  (1 pill) x 3 days    Dispense:  13 tablet    Refill:  0  . doxycycline (VIBRA-TABS) 100 MG tablet    Sig: Take 1 tablet (100 mg total) by mouth 2 (two) times daily.    Dispense:  20 tablet    Refill:  0  . albuterol (VENTOLIN HFA) 108 (90 Base) MCG/ACT inhaler    Sig: Inhale 1-2 puffs into the lungs every 6 (six) hours as needed for wheezing or shortness of breath.    Dispense:  8.5 g    Refill:  2  . brompheniramine-pseudoephedrine-DM 30-2-10 MG/5ML syrup    Sig: Take 5 mLs by mouth 4 (four) times daily as needed.    Dispense:  118 mL    Refill:  0    Follow up plan: Return if symptoms worsen or fail to improve.   Harlin Rain, [place] [facility] Nurse Practition[facility] 07/01/2020, 10:10 AM

## 2020-07-01 NOTE — Assessment & Plan Note (Signed)
COPD exacerbation x 10 days.  Will swab for COVID due to recent symptom onset.  Will treat with doxycycline 100mg  BID x 10 days, prednisone taper, albuterol HFA 1-2 puffs every 4-6 hours as needed for SOB/cough, and cough medicine to take as directed.  To keep upcoming follow up appointment with pulmonology and RTC PRN.

## 2020-07-02 LAB — SARS-COV-2, NAA 2 DAY TAT

## 2020-07-02 LAB — NOVEL CORONAVIRUS, NAA: SARS-CoV-2, NAA: NOT DETECTED

## 2021-04-07 IMAGING — CT CT CHEST LUNG CANCER SCREENING LOW DOSE W/O CM
2 of 5 series · 15 of 40 positions shown, 18 images · non-contrast
Comparison: None.

CLINICAL DATA: Former smoker, quit 6 months ago, 80 pack-year
history.

EXAM:
CT CHEST WITHOUT CONTRAST LOW-DOSE FOR LUNG CANCER SCREENING
TECHNIQUE: Multidetector CT imaging of the chest was performed following the
standard protocol without IV contrast.

[Series 3: lung 1.00 · axial · 0.58mm/px · z∈[-1226,-924]mm · 12 of 334 slices shown, 15 images]
[im 16/334  mediastinal]
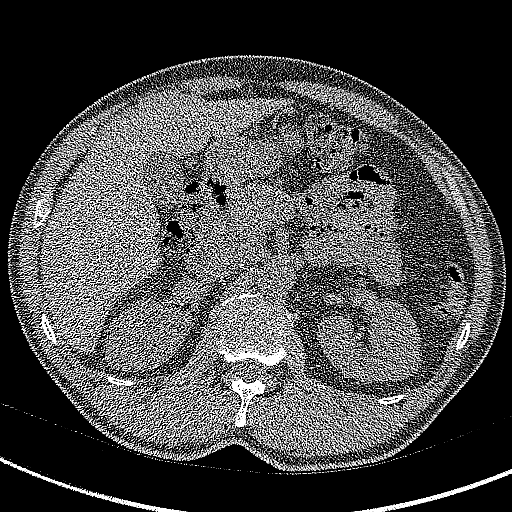
[im 16/334  lung]
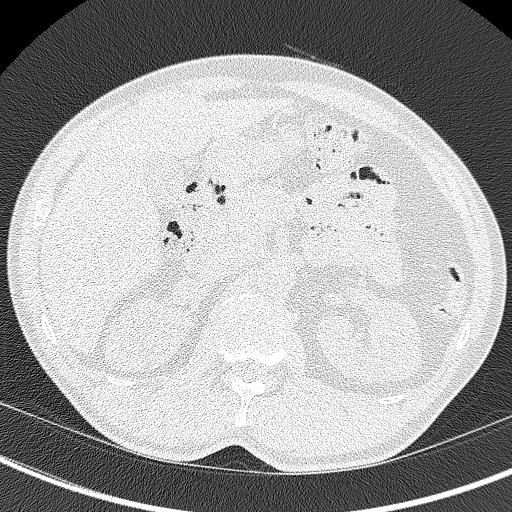
[im 48/334  lung]
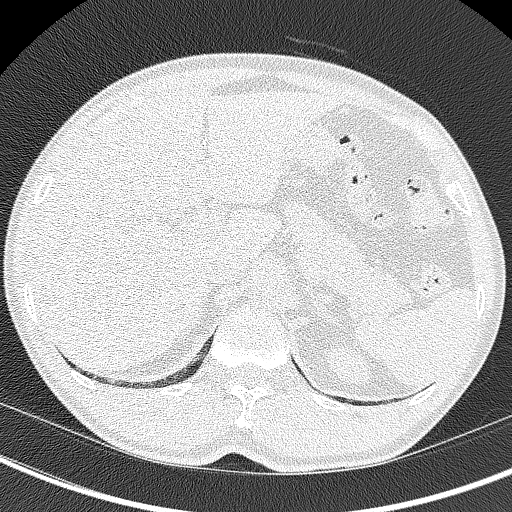
[im 80/334  lung]
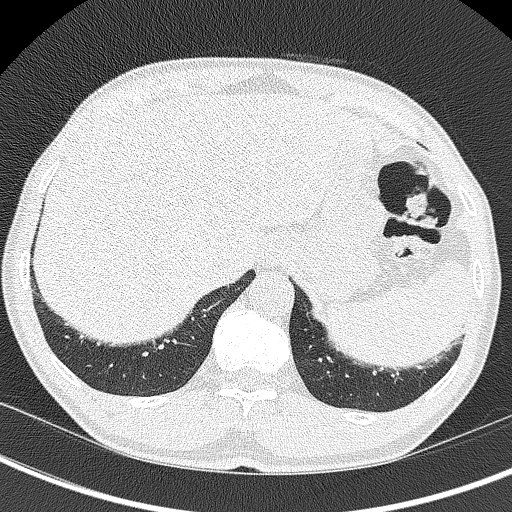
[im 96/334  lung]
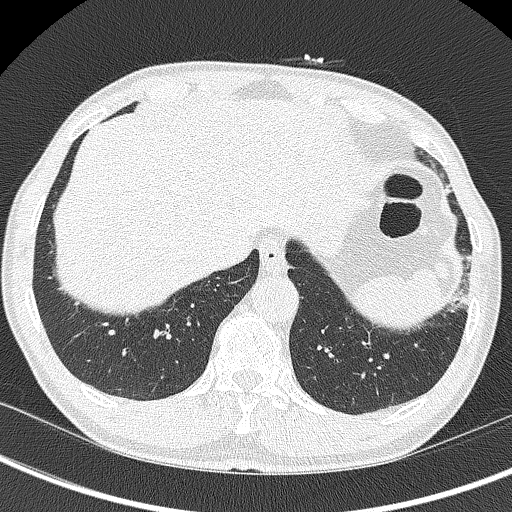
[im 127/334  mediastinal]
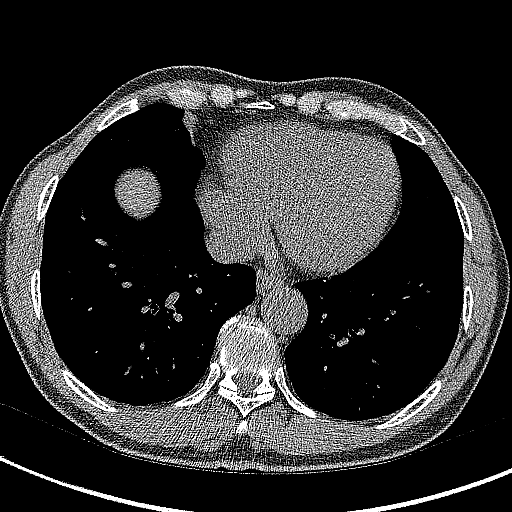
[im 127/334  lung]
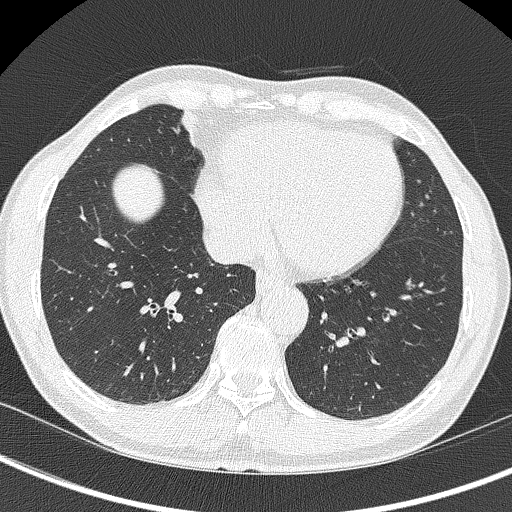
[im 159/334  lung]
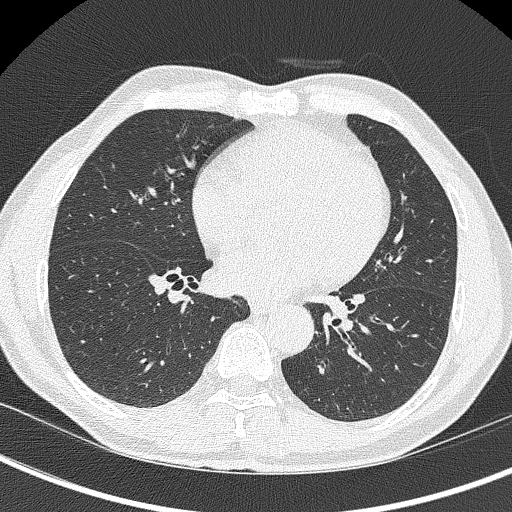
[im 175/334  lung]
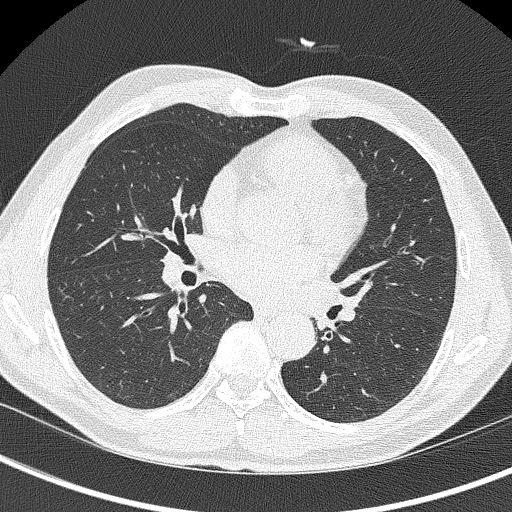
[im 207/334  lung]
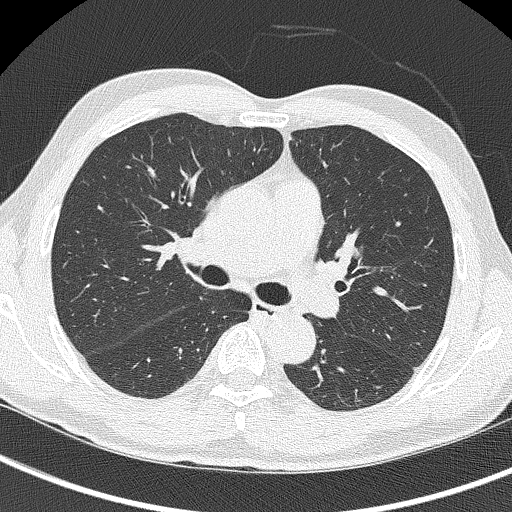
[im 238/334  mediastinal]
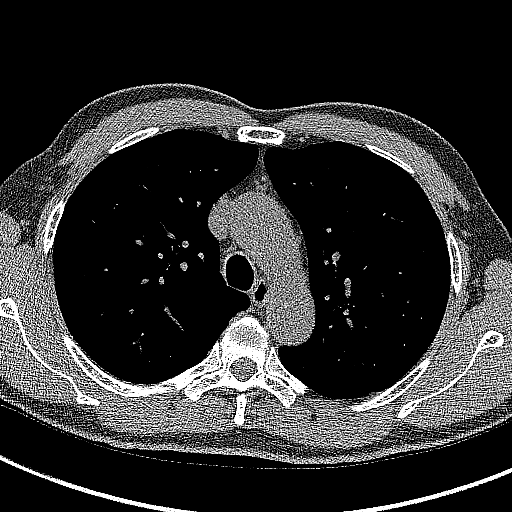
[im 238/334  lung]
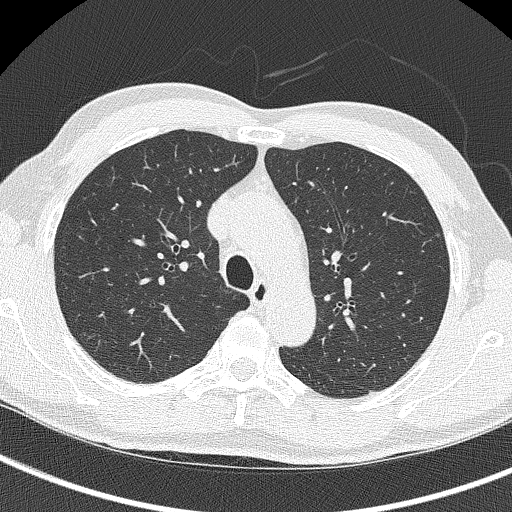
[im 254/334  lung]
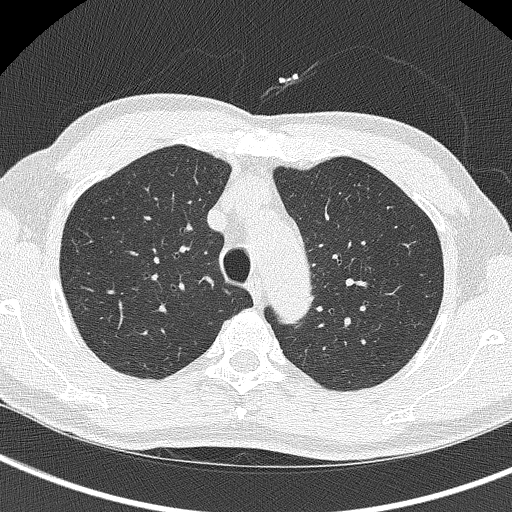
[im 286/334  lung]
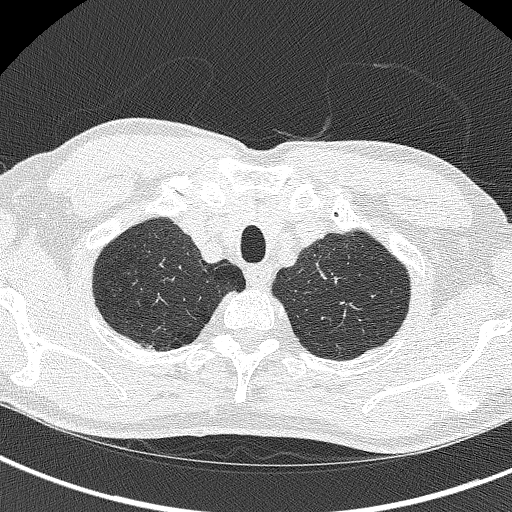
[im 318/334  lung]
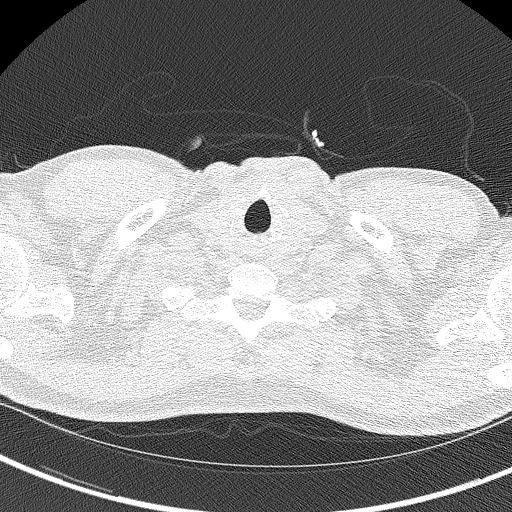

[Series 4: coronals lung 1.00 cor · coronal · 0.58mm/px · 3 of 247 slices shown]
[im 50/247  lung]
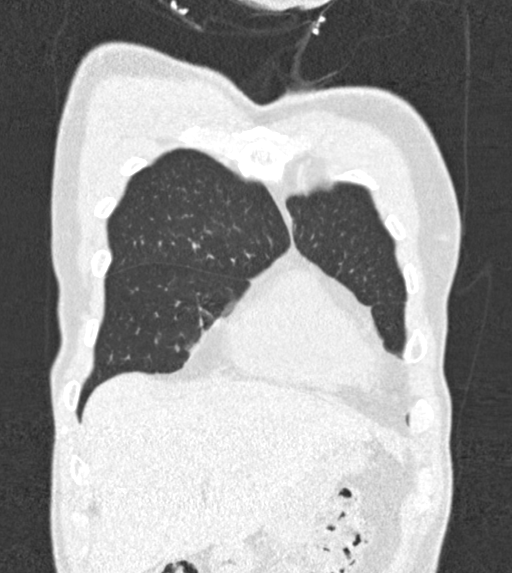
[im 99/247  lung]
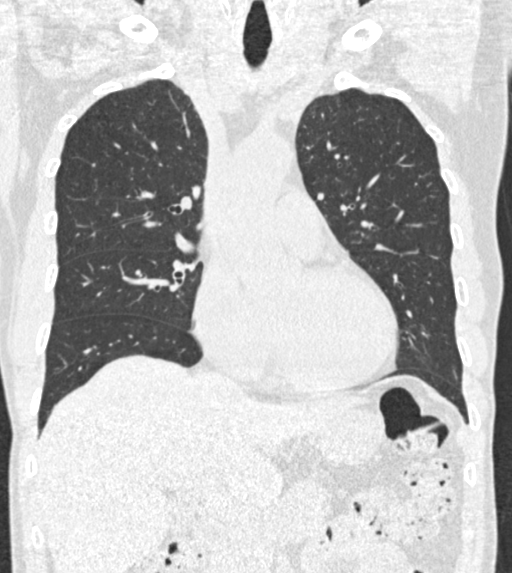
[im 148/247  lung]
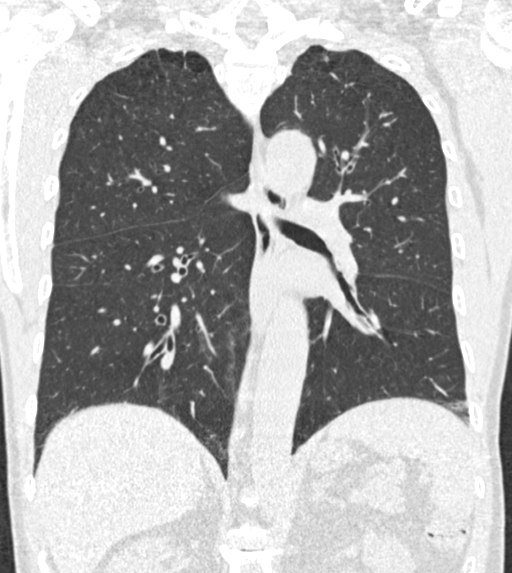

[15 of 40 positions shown; findings below may reference images not displayed]

FINDINGS: Cardiovascular: Heart is at the upper limits of normal in size. No
pericardial effusion.

Mediastinum/Nodes: No pathologically enlarged mediastinal or
axillary lymph nodes. Hilar regions are difficult to definitively
evaluate without IV contrast but appear grossly unremarkable.
Esophagus is grossly unremarkable.

Lungs/Pleura: Bullous paraseptal and centrilobular emphysema.
Residual smoking related respiratory bronchiolitis and peribronchial
thickening. No suspicious pulmonary nodules. Scattered mucoid
impaction. No pleural fluid. Airway is unremarkable.

Upper Abdomen: There may be a subcentimeter low-attenuation lesion
in the inferior right hepatic lobe, too small to characterize.
Visualized portions of the liver, gallbladder, adrenal glands,
kidneys, spleen, pancreas, stomach and bowel are otherwise grossly
unremarkable.

Musculoskeletal: Mild degenerative changes in the spine. No
worrisome lytic or sclerotic lesions.
IMPRESSION: 1. Lung-RADS 1, negative. Continue annual screening with low-dose
chest CT without contrast in 12 months.
2.  Emphysema ([id number]).

## 2021-07-02 ENCOUNTER — Ambulatory Visit
Admission: RE | Admit: 2021-07-02 | Discharge: 2021-07-02 | Disposition: A | Discharge status: Home / Self Care | Payer: Commercial Managed Care - PPO | Source: Ambulatory Visit | Attending: Internal Medicine | Admitting: Internal Medicine

## 2021-07-02 ENCOUNTER — Ambulatory Visit: Payer: Commercial Managed Care - PPO | Admitting: Internal Medicine

## 2021-07-02 ENCOUNTER — Other Ambulatory Visit: Payer: Self-pay

## 2021-07-02 ENCOUNTER — Ambulatory Visit
Admission: RE | Admit: 2021-07-02 | Discharge: 2021-07-02 | Disposition: A | Discharge status: Home / Self Care | Payer: Commercial Managed Care - PPO | Attending: Internal Medicine | Admitting: Internal Medicine

## 2021-07-02 ENCOUNTER — Encounter: Payer: Self-pay | Admitting: Internal Medicine

## 2021-07-02 VITALS — BP 113/81 | HR 72 | Temp 97.3°F | Resp 18 | Ht 62.0 in | Wt 140.8 lb

## 2021-07-02 DIAGNOSIS — R0789 Other chest pain: Secondary | ICD-10-CM | POA: Diagnosis not present

## 2021-07-02 DIAGNOSIS — M549 Dorsalgia, unspecified: Secondary | ICD-10-CM

## 2021-07-02 DIAGNOSIS — R051 Acute cough: Secondary | ICD-10-CM | POA: Insufficient documentation

## 2021-07-02 DIAGNOSIS — J410 Simple chronic bronchitis: Secondary | ICD-10-CM

## 2021-07-02 MED ORDER — CYCLOBENZAPRINE HCL 5 MG PO TABS: 5.0000 mg | ORAL_TABLET | Freq: Three times a day (TID) | ORAL | 0 refills | Status: DC | PRN

## 2021-07-02 MED ORDER — BENZONATATE 200 MG PO CAPS: 200.0000 mg | ORAL_CAPSULE | Freq: Three times a day (TID) | ORAL | 0 refills | Status: DC | PRN

## 2021-07-02 NOTE — Progress Notes (Signed)
Subjective:    Patient ID: Melvin Ramos, male    DOB: 08-Jan-1966, 56 y.o.   MRN: [id number]  HPI  Pt presents to the clinic today with c/o cough, chest and back pain. This started 2 weeks ago.  The cough is mostly nonproductive.  He reports slight shortness of breath but not severe and not necessarily different from his baseline.  The chest pain and back pain is worse when he takes a deep breath.  He describes the pain as sharp and stabbing.  He denies headache, dizziness, vision changes, runny nose, nasal congestion, ear pain, sore throat, nausea.  He denies fever, chills or body aches.  He has tried Tylenol, Ibuprofen and TheraFlu OTC with minimal relief of symptoms.  He denies any injury to the area but does do heavy lifting at work and is unsure if this is related.  He has a history of a chronic cough. He is a former smoker. He has a history of COPD, managed on Trelegy and Albuterol. He follows with pulmonology.  Review of Systems  Past Medical History:  Diagnosis Date   Hemorrhoid     Current Outpatient Medications  Medication Sig Dispense Refill   albuterol (VENTOLIN HFA) 108 (90 Base) MCG/ACT inhaler Inhale 1-2 puffs into the lungs every 6 (six) hours as needed for wheezing or shortness of breath. 8.5 g 2   brompheniramine-pseudoephedrine-DM 30-2-10 MG/5ML syrup Take 5 mLs by mouth 4 (four) times daily as needed. 118 mL 0   clotrimazole-betamethasone (LOTRISONE) cream Apply 1 application topically 2 (two) times daily. 30 g 0   diclofenac (VOLTAREN) 75 MG EC tablet Take 1 tablet (75 mg total) by mouth 2 (two) times daily. 30 tablet 0   diclofenac Sodium (VOLTAREN) 1 % GEL Apply 2 g topically 4 (four) times daily. 50 g 1   doxycycline (VIBRA-TABS) 100 MG tablet Take 1 tablet (100 mg total) by mouth 2 (two) times daily. 20 tablet 0   hydrocortisone cream 0.5 % Apply 1 application topically 2 (two) times daily. 30 g 0   levofloxacin (LEVAQUIN) 500 MG tablet Take 1 tablet (500 mg total) by  mouth daily. For 7 days (Patient not taking: Reported on 07/01/2020) 7 tablet 0   predniSONE (DELTASONE) 20 MG tablet Take daily with food. Start with 60mg  (3 pills) x 2 days, then reduce to 40mg  (2 pills) x 2 days, then 20mg  (1 pill) x 3 days 13 tablet 0   TRELEGY ELLIPTA 100-62.5-25 MCG/INH AEPB Inhale 1 puff into the lungs daily. 1 each 0   No current facility-administered medications for this visit.    No Known Allergies  Family History  Problem Relation Age of Onset   Depression Mother    Heart disease Mother    Stroke Mother     Social History   Socioeconomic History   Marital status: Single    Spouse name: Not on file   Number of children: Not on file   Years of education: Not on file   Highest education level: Not on file  Occupational History   Not on file  Tobacco Use   Smoking status: Former    Packs/day: 2.00    Years: 40.00    Pack years: 80.00    Types: Cigarettes    Quit date: 07/09/2019    Years since quitting: 1.9   Smokeless tobacco: Never  Vaping Use   Vaping Use: Never used  Substance and Sexual Activity   Alcohol use: Yes    Comment: "  sometimes"   Drug use: No   Sexual activity: Not on file    Comment: not asked  Other Topics Concern   Not on file  Social History Narrative   Not on file   Social Determinants of Health   Financial Resource Strain: Not on file  Food Insecurity: Not on file  Transportation Needs: Not on file  Physical Activity: Not on file  Stress: Not on file  Social Connections: Not on file  Intimate Partner Violence: Not on file     Constitutional: Denies fever, malaise, fatigue, headache or abrupt weight changes.  HEENT: Denies eye pain, eye redness, ear pain, ringing in the ears, wax buildup, runny nose, nasal congestion, bloody nose, or sore throat. Respiratory: Pt reports cough and minimal shortness of breath. Denies difficulty breathing,  or sputum production.   Cardiovascular: Pt reports chest pain. Denies chest  tightness, palpitations or swelling in the hands or feet.  Gastrointestinal: Denies abdominal pain, bloating, constipation, diarrhea or blood in the stool.  Musculoskeletal: Patient reports upper back pain.  Denies decrease in range of motion, difficulty with gait,  or joint pain and swelling.  Skin: Denies redness, rashes, lesions or ulcercations.  Neurological: Denies dizziness, difficulty with memory, difficulty with speech or problems with balance and coordination.   No other specific complaints in a complete review of systems (except as listed in HPI above).     Objective:   Physical Exam  BP 113/81 (BP Location: Right Arm, Patient Position: Sitting, Cuff Size: Normal)    Pulse 72    Temp (!) 97.3 F (36.3 C) (Temporal)    Resp 18    Ht 5\' 2"  (1.575 m)    Wt 140 lb 12.8 oz (63.9 kg)    SpO2 98%    BMI 25.75 kg/m   Wt Readings from Last 3 Encounters:  07/01/20 137 lb 6.4 oz (62.3 kg)  04/21/20 136 lb (61.7 kg)  04/15/20 136 lb (61.7 kg)    General: Appears his stated age, well developed, well nourished in NAD. Skin: Warm, dry and intact. No rashes noted. Cardiovascular: Normal rate and rhythm. S1,S2 noted.  No murmur, rubs or gallops noted. Pulmonary/Chest: Normal effort and positive vesicular breath sounds. No respiratory distress. No wheezes, rales or ronchi noted.  Musculoskeletal: Pain with palpation of the chest wall and upper back.  Strength 5/5 BUE.  No difficulty with gait.  Neurological: Alert and oriented.    BMET    Component Value Date/Time   NA 140 12/25/2019 0844   K 3.8 12/25/2019 0844   CL 106 12/25/2019 0844   CO2 26 12/25/2019 0844   GLUCOSE 132 (H) 12/25/2019 0844   BUN 17 12/25/2019 0844   CREATININE 0.79 12/25/2019 0844   CALCIUM 9.2 12/25/2019 0844   GFRNONAA 101 12/25/2019 0844   GFRAA 117 12/25/2019 0844    Lipid Panel     Component Value Date/Time   CHOL 169 06/20/2018 0939   TRIG 152 (H) 06/20/2018 0939   HDL 41 06/20/2018 0939    CHOLHDL 4.1 06/20/2018 0939   LDLCALC 103 (H) 06/20/2018 0939    CBC    Component Value Date/Time   WBC 5.9 12/25/2019 0844   RBC 4.47 12/25/2019 0844   HGB 14.1 12/25/2019 0844   HCT 42.2 12/25/2019 0844   PLT 202 12/25/2019 0844   MCV 94.4 12/25/2019 0844   MCH 31.5 12/25/2019 0844   MCHC 33.4 12/25/2019 0844   RDW 12.8 12/25/2019 0844   LYMPHSABS 2,059  12/25/2019 0844   MONOABS 0.6 07/09/2019 1127   EOSABS 330 12/25/2019 0844   BASOSABS 30 12/25/2019 0844    Hgb A1C Lab Results  Component Value Date   HGBA1C 5.2 12/25/2019           Assessment & Plan:   Cough, Chest Wall Pain, Upper Back Pain, Hx of COPD:  Will obtain chest x-ray today Rx for Tessalon 200 mg 3 times daily as needed for cough Continue inhalers as previously prescribed Rx for Flexeril 5 mg every 8 hours as needed-sedation caution given (this could be muscular)   Webb Silversmith, NP This visit occurred during the SARS-CoV-2 public health emergency.  Safety protocols were in place, including screening questions prior to the visit, additional usage of staff PPE, and extensive cleaning of exam room while observing appropriate contact time as indicated for disinfecting solutions.

## 2021-07-02 NOTE — Patient Instructions (Signed)

## 2021-08-18 ENCOUNTER — Other Ambulatory Visit: Payer: Self-pay | Admitting: *Deleted

## 2021-08-18 DIAGNOSIS — Z87891 Personal history of nicotine dependence: Secondary | ICD-10-CM

## 2021-08-26 ENCOUNTER — Ambulatory Visit
Admission: RE | Admit: 2021-08-26 | Discharge: 2021-08-26 | Disposition: A | Discharge status: Home / Self Care | Payer: Commercial Managed Care - PPO | Source: Ambulatory Visit | Attending: Acute Care | Admitting: Acute Care

## 2021-08-26 ENCOUNTER — Other Ambulatory Visit: Payer: Self-pay

## 2021-08-26 DIAGNOSIS — Z87891 Personal history of nicotine dependence: Secondary | ICD-10-CM | POA: Diagnosis present

## 2021-08-28 ENCOUNTER — Other Ambulatory Visit: Payer: Self-pay

## 2021-08-28 DIAGNOSIS — Z87891 Personal history of nicotine dependence: Secondary | ICD-10-CM

## 2022-02-09 ENCOUNTER — Ambulatory Visit: Payer: Commercial Managed Care - PPO | Admitting: Internal Medicine

## 2022-02-09 ENCOUNTER — Encounter: Payer: Self-pay | Admitting: Internal Medicine

## 2022-02-09 VITALS — BP 120/82 | HR 73 | Temp 97.5°F | Ht 66.0 in | Wt 143.0 lb

## 2022-02-09 DIAGNOSIS — Z23 Encounter for immunization: Secondary | ICD-10-CM | POA: Diagnosis not present

## 2022-02-09 DIAGNOSIS — R7309 Other abnormal glucose: Secondary | ICD-10-CM

## 2022-02-09 DIAGNOSIS — Z125 Encounter for screening for malignant neoplasm of prostate: Secondary | ICD-10-CM

## 2022-02-09 DIAGNOSIS — Z0001 Encounter for general adult medical examination with abnormal findings: Secondary | ICD-10-CM

## 2022-02-09 DIAGNOSIS — Z1211 Encounter for screening for malignant neoplasm of colon: Secondary | ICD-10-CM | POA: Diagnosis not present

## 2022-02-09 DIAGNOSIS — E782 Mixed hyperlipidemia: Secondary | ICD-10-CM

## 2022-02-09 NOTE — Patient Instructions (Signed)
Health Maintenance, Male Adopting a healthy lifestyle and getting preventive care are important in promoting health and wellness. Ask your health care provider about: The right schedule for you to have regular tests and exams. Things you can do on your own to prevent diseases and keep yourself healthy. What should I know about diet, weight, and exercise? Eat a healthy diet  Eat a diet that includes plenty of vegetables, fruits, low-fat dairy products, and lean protein. Do not eat a lot of foods that are high in solid fats, added sugars, or sodium. Maintain a healthy weight Body mass index (BMI) is a measurement that can be used to identify possible weight problems. It estimates body fat based on height and weight. Your health care provider can help determine your BMI and help you achieve or maintain a healthy weight. Get regular exercise Get regular exercise. This is one of the most important things you can do for your health. Most adults should: Exercise for at least 150 minutes each week. The exercise should increase your heart rate and make you sweat (moderate-intensity exercise). Do strengthening exercises at least twice a week. This is in addition to the moderate-intensity exercise. Spend less time sitting. Even light physical activity can be beneficial. Watch cholesterol and blood lipids Have your blood tested for lipids and cholesterol at 58 years of age, then have this test every 5 years. You may need to have your cholesterol levels checked more often if: Your lipid or cholesterol levels are high. You are older than 58 years of age. You are at high risk for heart disease. What should I know about cancer screening? Many types of cancers can be detected early and may often be prevented. Depending on your health history and family history, you may need to have cancer screening at various ages. This may include screening for: Colorectal cancer. Prostate cancer. Skin cancer. Lung  cancer. What should I know about heart disease, diabetes, and high blood pressure? Blood pressure and heart disease High blood pressure causes heart disease and increases the risk of stroke. This is more likely to develop in people who have high blood pressure readings or are overweight. Talk with your health care provider about your target blood pressure readings. Have your blood pressure checked: Every 3-5 years if you are 18-39 years of age. Every year if you are 40 years old or older. If you are between the ages of 65 and 75 and are a current or former smoker, ask your health care provider if you should have a one-time screening for abdominal aortic aneurysm (AAA). Diabetes Have regular diabetes screenings. This checks your fasting blood sugar level. Have the screening done: Once every three years after age 45 if you are at a normal weight and have a low risk for diabetes. More often and at a younger age if you are overweight or have a high risk for diabetes. What should I know about preventing infection? Hepatitis B If you have a higher risk for hepatitis B, you should be screened for this virus. Talk with your health care provider to find out if you are at risk for hepatitis B infection. Hepatitis C Blood testing is recommended for: Everyone born from 1945 through 1965. Anyone with known risk factors for hepatitis C. Sexually transmitted infections (STIs) You should be screened each year for STIs, including gonorrhea and chlamydia, if: You are sexually active and are younger than 58 years of age. You are older than 58 years of age and your   health care provider tells you that you are at risk for this type of infection. Your sexual activity has changed since you were last screened, and you are at increased risk for chlamydia or gonorrhea. Ask your health care provider if you are at risk. Ask your health care provider about whether you are at high risk for HIV. Your health care provider  may recommend a prescription medicine to help prevent HIV infection. If you choose to take medicine to prevent HIV, you should first get tested for HIV. You should then be tested every 3 months for as long as you are taking the medicine. Follow these instructions at home: Alcohol use Do not drink alcohol if your health care provider tells you not to drink. If you drink alcohol: Limit how much you have to 0-2 drinks a day. Know how much alcohol is in your drink. In the [place] one drink equals one 12 oz bottle of beer (355 mL), one 5 oz glass of wine (148 mL), or one 1 oz glass of hard liquor (44 mL). Lifestyle Do not use any products that contain nicotine or tobacco. These products include cigarettes, chewing tobacco, and vaping devices, such as e-cigarettes. If you need help quitting, ask your health care provider. Do not use street drugs. Do not share needles. Ask your health care provider for help if you need support or information about quitting drugs. General instructions Schedule regular health, dental, and eye exams. Stay current with your vaccines. Tell your health care provider if: You often feel depressed. You have ever been abused or do not feel safe at home. Summary Adopting a healthy lifestyle and getting preventive care are important in promoting health and wellness. Follow your health care provider's instructions about healthy diet, exercising, and getting tested or screened for diseases. Follow your health care provider's instructions on monitoring your cholesterol and blood pressure. This information is not intended to replace advice given to you by your health care provider. Make sure you discuss any questions you have with your health care provider. Document Revised: 10/13/2020 Document Reviewed: 10/13/2020 Elsevier Patient Education  2023 [employer] Inc[employer]  

## 2022-02-09 NOTE — Progress Notes (Signed)
Subjective:    Patient ID: Melvin Ramos, male    DOB: 1965/10/02, 56 y.o.   MRN: [id number]  HPI  Patient presents to clinic today for his annual exam.  Flu: 03/2019 Tetanus: 06/2018 COVID: Pfizer x3 Shingrix: Never PSA screening: 06/2018 Colon screening: never Vision screening: as needed Dentist: biannually  Diet: He does eat meat. He consumes fruits and veggies. He does eat some fried foods. He drinks mostly water, sweet tea and beer. Exercise: None  Review of Systems     Past Medical History:  Diagnosis Date   Hemorrhoid     Current Outpatient Medications  Medication Sig Dispense Refill   albuterol (VENTOLIN HFA) 108 (90 Base) MCG/ACT inhaler Inhale 1-2 puffs into the lungs every 6 (six) hours as needed for wheezing or shortness of breath. 8.5 g 2   benzonatate (TESSALON) 200 MG capsule Take 1 capsule (200 mg total) by mouth 3 (three) times daily as needed for cough. 30 capsule 0   cyclobenzaprine (FLEXERIL) 5 MG tablet Take 1 tablet (5 mg total) by mouth 3 (three) times daily as needed for muscle spasms. 15 tablet 0   ipratropium-albuterol (DUONEB) 0.5-2.5 (3) MG/3ML SOLN SMARTSIG:3 Milliliter(s) Via Nebulizer 4 Times Daily PRN     TRELEGY ELLIPTA 100-62.5-25 MCG/INH AEPB Inhale 1 puff into the lungs daily. 1 each 0   No current facility-administered medications for this visit.    No Known Allergies  Family History  Problem Relation Age of Onset   Depression Mother    Heart disease Mother    Stroke Mother     Social History   Socioeconomic History   Marital status: Single    Spouse name: Not on file   Number of children: Not on file   Years of education: Not on file   Highest education level: Not on file  Occupational History   Not on file  Tobacco Use   Smoking status: Former    Packs/day: 2.00    Years: 40.00    Total pack years: 80.00    Types: Cigarettes    Quit date: 07/09/2019    Years since quitting: 2.5   Smokeless tobacco: Never  Vaping Use    Vaping Use: Never used  Substance and Sexual Activity   Alcohol use: Yes    Comment: "sometimes"   Drug use: No   Sexual activity: Not on file    Comment: not asked  Other Topics Concern   Not on file  Social History Narrative   Not on file   Social Determinants of Health   Financial Resource Strain: Not on file  Food Insecurity: Not on file  Transportation Needs: Not on file  Physical Activity: Not on file  Stress: Not on file  Social Connections: Not on file  Intimate Partner Violence: Not on file     Constitutional: Denies fever, malaise, fatigue, headache or abrupt weight changes.  HEENT: Denies eye pain, eye redness, ear pain, ringing in the ears, wax buildup, runny nose, nasal congestion, bloody nose, or sore throat. Respiratory: Patient reports cough.  Denies difficulty breathing, shortness of breath, or sputum production.   Cardiovascular: Denies chest pain, chest tightness, palpitations or swelling in the hands or feet.  Gastrointestinal: Denies abdominal pain, bloating, constipation, diarrhea or blood in the stool.  GU: Denies urgency, frequency, pain with urination, burning sensation, blood in urine, odor or discharge. Musculoskeletal: Patient reports chronic elbow pain.  Denies decrease in range of motion, difficulty with gait, muscle pain or joint  swelling.  Skin: Denies redness, rashes, lesions or ulcercations.  Neurological: Denies dizziness, difficulty with memory, difficulty with speech or problems with balance and coordination.  Psych: Denies anxiety, depression, SI/HI.  No other specific complaints in a complete review of systems (except as listed in HPI above).  Objective:   Physical Exam BP 120/82 (BP Location: Left Arm, Patient Position: Sitting, Cuff Size: Normal)   Pulse 73   Temp (!) 97.5 F (36.4 C) (Temporal)   Ht 5' 6"  (1.676 m)   Wt 143 lb (64.9 kg)   SpO2 98%   BMI 23.08 kg/m   Wt Readings from Last 3 Encounters:  08/26/21 142 lb  (64.4 kg)  07/02/21 140 lb 12.8 oz (63.9 kg)  07/01/20 137 lb 6.4 oz (62.3 kg)    General: Appears his stated age, well developed, well nourished in NAD. Skin: Warm, dry and intact.  HEENT: Head: normal shape and size; Eyes: sclera white, no icterus, conjunctiva pink, PERRLA and EOMs intact;  Neck:  Neck supple, trachea midline. No masses, lumps or thyromegaly present.  Cardiovascular: Normal rate and rhythm. S1,S2 noted.  No murmur, rubs or gallops noted. No JVD or BLE edema. No carotid bruits noted. Pulmonary/Chest: Normal effort and positive vesicular breath sounds. No respiratory distress. No wheezes, rales or ronchi noted.  Abdomen: Soft and nontender. Normal bowel sounds. No distention or masses noted. Liver, spleen and kidneys non palpable. Musculoskeletal: Strength 5/5 BUE/BLE. No difficulty with gait.  Neurological: Alert and oriented. Cranial nerves II-XII grossly intact. Coordination normal.  Psychiatric: Mood and affect normal. Behavior is normal. Judgment and thought content normal.    BMET    Component Value Date/Time   NA 140 12/25/2019 0844   K 3.8 12/25/2019 0844   CL 106 12/25/2019 0844   CO2 26 12/25/2019 0844   GLUCOSE 132 (H) 12/25/2019 0844   BUN 17 12/25/2019 0844   CREATININE 0.79 12/25/2019 0844   CALCIUM 9.2 12/25/2019 0844   GFRNONAA 101 12/25/2019 0844   GFRAA 117 12/25/2019 0844    Lipid Panel     Component Value Date/Time   CHOL 169 06/20/2018 0939   TRIG 152 (H) 06/20/2018 0939   HDL 41 06/20/2018 0939   CHOLHDL 4.1 06/20/2018 0939   LDLCALC 103 (H) 06/20/2018 0939    CBC    Component Value Date/Time   WBC 5.9 12/25/2019 0844   RBC 4.47 12/25/2019 0844   HGB 14.1 12/25/2019 0844   HCT 42.2 12/25/2019 0844   PLT 202 12/25/2019 0844   MCV 94.4 12/25/2019 0844   MCH 31.5 12/25/2019 0844   MCHC 33.4 12/25/2019 0844   RDW 12.8 12/25/2019 0844   LYMPHSABS 2,059 12/25/2019 0844   MONOABS 0.6 07/09/2019 1127   EOSABS 330 12/25/2019 0844    BASOSABS 30 12/25/2019 0844    Hgb A1C Lab Results  Component Value Date   HGBA1C 5.2 12/25/2019           Assessment & Plan:   Preventative Health Maintenance:  Flu shot today Tetanus UTD Encouraged him to get his COVID booster Discussed Shingrix vaccine, he will check coverage with his insurance company and schedule a nurse visit if he would like to have this done Referral to GI for screening colonoscopy Encouraged him to consume a balanced diet and exercise regimen Advised him to see an eye doctor and dentist annually We will check CBC, c-Met, lipid, A1c, PSA today  RTC in 6 months, follow-up chronic conditions Webb Silversmith, NP

## 2022-02-10 ENCOUNTER — Telehealth: Payer: Self-pay

## 2022-02-10 ENCOUNTER — Other Ambulatory Visit: Payer: Self-pay

## 2022-02-10 DIAGNOSIS — Z1211 Encounter for screening for malignant neoplasm of colon: Secondary | ICD-10-CM

## 2022-02-10 LAB — CBC
HCT: 46.7 % (ref 38.5–50.0)
Hemoglobin: 15.5 g/dL (ref 13.2–17.1)
MCH: 31.3 pg (ref 27.0–33.0)
MCHC: 33.2 g/dL (ref 32.0–36.0)
MCV: 94.2 fL (ref 80.0–100.0)
MPV: 11.6 fL (ref 7.5–12.5)
Platelets: 191 10*3/uL (ref 140–400)
RBC: 4.96 10*6/uL (ref 4.20–5.80)
RDW: 12.8 % (ref 11.0–15.0)
WBC: 5.2 10*3/uL (ref 3.8–10.8)

## 2022-02-10 LAB — LIPID PANEL
Cholesterol: 201 mg/dL — ABNORMAL HIGH (ref <200)
HDL: 49 mg/dL (ref >=40)
LDL Cholesterol (Calc): 118 mg/dL (calc) — ABNORMAL HIGH
Non-HDL Cholesterol (Calc): 152 mg/dL (calc) — ABNORMAL HIGH (ref <130)
Total CHOL/HDL Ratio: 4.1 (calc) (ref <5.0)
Triglycerides: 220 mg/dL — ABNORMAL HIGH (ref <150)

## 2022-02-10 LAB — COMPLETE METABOLIC PANEL WITH GFR
AG Ratio: 1.7 (calc) (ref 1.0–2.5)
ALT: 19 U/L (ref 9–46)
AST: 17 U/L (ref 10–35)
Albumin: 4.5 g/dL (ref 3.6–5.1)
Alkaline phosphatase (APISO): 51 U/L (ref 35–144)
BUN: 18 mg/dL (ref 7–25)
CO2: 26 mmol/L (ref 20–32)
Calcium: 9.4 mg/dL (ref 8.6–10.3)
Chloride: 107 mmol/L (ref 98–110)
Creat: 0.93 mg/dL (ref 0.70–1.30)
Globulin: 2.7 g/dL (calc) (ref 1.9–3.7)
Glucose, Bld: 79 mg/dL (ref 65–99)
Potassium: 4.5 mmol/L (ref 3.5–5.3)
Sodium: 142 mmol/L (ref 135–146)
Total Bilirubin: 0.4 mg/dL (ref 0.2–1.2)
Total Protein: 7.2 g/dL (ref 6.1–8.1)
eGFR: 96 mL/min/{1.73_m2} (ref >=60)

## 2022-02-10 LAB — PSA: PSA: 0.64 ng/mL (ref <=4.00)

## 2022-02-10 LAB — HEMOGLOBIN A1C
Hgb A1c MFr Bld: 5.3 % of total Hgb (ref <5.7)
Mean Plasma Glucose: 105 mg/dL
eAG (mmol/L): 5.8 mmol/L

## 2022-02-10 MED ORDER — NA SULFATE-K SULFATE-MG SULF 17.5-3.13-1.6 GM/177ML PO SOLN: 1.0000 | Freq: Once | ORAL | 0 refills | Status: AC

## 2022-02-10 NOTE — Telephone Encounter (Signed)
Gastroenterology Pre-Procedure Review  Request Date: 03/11/22 Requesting Physician: Dr. Allen Norris  PATIENT REVIEW QUESTIONS: The patient responded to the following health history questions as indicated:    1. Are you having any GI issues? no 2. Do you have a personal history of Polyps? no 3. Do you have a family history of Colon Cancer or Polyps? no 4. Diabetes Mellitus? no 5. Joint replacements in the past 12 months?no 6. Major health problems in the past 3 months?no 7. Any artificial heart valves, MVP, or defibrillator?no    MEDICATIONS & ALLERGIES:    Patient reports the following regarding taking any anticoagulation/antiplatelet therapy:   Plavix, Coumadin, Eliquis, Xarelto, Lovenox, Pradaxa, Brilinta, or Effient? no Aspirin? no  Patient confirms/reports the following medications:  Current Outpatient Medications  Medication Sig Dispense Refill   albuterol (VENTOLIN HFA) 108 (90 Base) MCG/ACT inhaler Inhale 1-2 puffs into the lungs every 6 (six) hours as needed for wheezing or shortness of breath. 8.5 g 2   ipratropium-albuterol (DUONEB) 0.5-2.5 (3) MG/3ML SOLN SMARTSIG:3 Milliliter(s) Via Nebulizer 4 Times Daily PRN     TRELEGY ELLIPTA 100-62.5-25 MCG/INH AEPB Inhale 1 puff into the lungs daily. 1 each 0   No current facility-administered medications for this visit.    Patient confirms/reports the following allergies:  No Known Allergies  No orders of the defined types were placed in this encounter.   AUTHORIZATION INFORMATION Primary Insurance: 1D#: Group #:  Secondary Insurance: 1D#: Group #:  SCHEDULE INFORMATION: Date: 03/11/22 Time: Location: [place]

## 2022-02-11 MED ORDER — ATORVASTATIN CALCIUM 10 MG PO TABS: 10.0000 mg | ORAL_TABLET | Freq: Every day | ORAL | 1 refills | Status: DC

## 2022-02-11 NOTE — Addendum Note (Signed)
Addended by: Jearld Fenton on: 02/11/2022 07:25 AM   Modules accepted: Orders

## 2022-02-16 ENCOUNTER — Other Ambulatory Visit: Payer: Self-pay | Admitting: Gastroenterology

## 2022-03-01 ENCOUNTER — Telehealth: Payer: Self-pay

## 2022-03-01 NOTE — Telephone Encounter (Signed)
Clearance has been obtained from Dr. Saralyn Pilar for patients scheduled colonoscopy with Dr. Allen Norris at [facility] 03/11/22.  Thanks,  [place], [place]

## 2022-03-04 ENCOUNTER — Encounter: Payer: Self-pay | Admitting: Gastroenterology

## 2022-03-05 ENCOUNTER — Other Ambulatory Visit: Payer: Self-pay | Admitting: Pulmonary Disease

## 2022-03-05 DIAGNOSIS — I209 Angina pectoris, unspecified: Secondary | ICD-10-CM

## 2022-03-11 ENCOUNTER — Encounter
Admission: RE | Disposition: A | Discharge status: Home / Self Care | Payer: Self-pay | Source: Home / Self Care | Attending: Gastroenterology

## 2022-03-11 ENCOUNTER — Ambulatory Visit: Payer: Commercial Managed Care - PPO | Admitting: Anesthesiology

## 2022-03-11 ENCOUNTER — Encounter: Payer: Self-pay | Admitting: Gastroenterology

## 2022-03-11 ENCOUNTER — Ambulatory Visit
Admission: RE | Admit: 2022-03-11 | Discharge: 2022-03-11 | Disposition: A | Discharge status: Home / Self Care | Payer: Commercial Managed Care - PPO | Attending: Gastroenterology | Admitting: Gastroenterology

## 2022-03-11 ENCOUNTER — Other Ambulatory Visit: Payer: Self-pay

## 2022-03-11 DIAGNOSIS — D125 Benign neoplasm of sigmoid colon: Secondary | ICD-10-CM | POA: Insufficient documentation

## 2022-03-11 DIAGNOSIS — D122 Benign neoplasm of ascending colon: Secondary | ICD-10-CM | POA: Insufficient documentation

## 2022-03-11 DIAGNOSIS — Z1211 Encounter for screening for malignant neoplasm of colon: Secondary | ICD-10-CM

## 2022-03-11 DIAGNOSIS — K635 Polyp of colon: Secondary | ICD-10-CM

## 2022-03-11 DIAGNOSIS — K640 First degree hemorrhoids: Secondary | ICD-10-CM | POA: Insufficient documentation

## 2022-03-11 DIAGNOSIS — J449 Chronic obstructive pulmonary disease, unspecified: Secondary | ICD-10-CM | POA: Diagnosis not present

## 2022-03-11 DIAGNOSIS — Z87891 Personal history of nicotine dependence: Secondary | ICD-10-CM | POA: Insufficient documentation

## 2022-03-11 HISTORY — DX: Chronic obstructive pulmonary disease, unspecified: J44.9

## 2022-03-11 HISTORY — PX: POLYPECTOMY: SHX5525

## 2022-03-11 HISTORY — PX: COLONOSCOPY WITH PROPOFOL: SHX5780

## 2022-03-11 SURGERY — COLONOSCOPY WITH PROPOFOL
Anesthesia: Monitor Anesthesia Care | Site: Rectum

## 2022-03-11 MED ORDER — LACTATED RINGERS IV SOLN: INTRAVENOUS | Status: DC

## 2022-03-11 MED ORDER — SODIUM CHLORIDE 0.9 % IV SOLN: INTRAVENOUS | Status: DC

## 2022-03-11 MED ORDER — STERILE WATER FOR IRRIGATION IR SOLN
Status: DC | PRN
  Administered 2022-03-11: 50 mL

## 2022-03-11 MED ORDER — PROPOFOL 10 MG/ML IV BOLUS
INTRAVENOUS | Status: DC | PRN
  Administered 2022-03-11 (×2): 100 mg via INTRAVENOUS
  Administered 2022-03-11: 50 mg via INTRAVENOUS

## 2022-03-11 SURGICAL SUPPLY — 9 items
FORCEPS BIOP RAD 4 LRG CAP 4 (CUTTING FORCEPS) IMPLANT
GOWN CVR UNV OPN BCK APRN NK (MISCELLANEOUS) ×2 IMPLANT
GOWN ISOL THUMB LOOP REG UNIV (MISCELLANEOUS) ×2
KIT PRC NS LF DISP ENDO (KITS) ×1 IMPLANT
KIT PROCEDURE OLYMPUS (KITS) ×1
MANIFOLD NEPTUNE II (INSTRUMENTS) ×1 IMPLANT
SNARE COLD EXACTO (MISCELLANEOUS) IMPLANT
TRAP ETRAP POLY (MISCELLANEOUS) IMPLANT
WATER STERILE IRR 250ML POUR (IV SOLUTION) ×1 IMPLANT

## 2022-03-11 NOTE — Anesthesia Postprocedure Evaluation (Signed)
Anesthesia Post Note  Patient: Melvin Ramos  Procedure(s) Performed: COLONOSCOPY WITH BIOPSY (Rectum) POLYPECTOMY (Rectum)  Patient location during evaluation: PACU Anesthesia Type: MAC Level of consciousness: awake and alert Pain management: pain level controlled Vital Signs Assessment: post-procedure vital signs reviewed and stable Respiratory status: spontaneous breathing, nonlabored ventilation, respiratory function stable and patient connected to nasal cannula oxygen Cardiovascular status: blood pressure returned to baseline and stable Postop Assessment: no apparent nausea or vomiting Anesthetic complications: no   No notable events documented.   Last Vitals:  Vitals:   03/11/22 0948 03/11/22 0952  BP: 105/87 123/85  Pulse: 67 (!) 59  Resp: 17 (!) 21  Temp:  (!) 36.2 C  SpO2: 100% 100%    Last Pain:  Vitals:   03/11/22 0952  TempSrc:   PainSc: 0-No pain                 Dimas Millin

## 2022-03-11 NOTE — Transfer of Care (Signed)
Immediate Anesthesia Transfer of Care Note  Patient: Melvin Ramos  Procedure(s) Performed: COLONOSCOPY WITH BIOPSY (Rectum) POLYPECTOMY (Rectum)  Patient Location: PACU  Anesthesia Type: MAC  Level of Consciousness: awake, alert  and patient cooperative  Airway and Oxygen Therapy: Patient Spontanous Breathing and Patient connected to supplemental oxygen  Post-op Assessment: Post-op Vital signs reviewed, Patient's Cardiovascular Status Stable, Respiratory Function Stable, Patent Airway and No signs of Nausea or vomiting  Post-op Vital Signs: Reviewed and stable  Complications: No notable events documented.

## 2022-03-11 NOTE — Op Note (Signed)
[facility] Gastroenterology Patient Name: Melvin Ramos Procedure Date: 03/11/2022 8:58 AM MRN: [id number] Account #: [id number] Date of Birth: [birth year] Admit Type: Outpatient Age: 56 Room: [facility] OR ROOM 01 Gender: Male Note Status: Finalized Instrument Name: [id number] Procedure:             Colonoscopy Indications:           Screening for colorectal malignant neoplasm Providers:             Lucilla Lame MD, MD Referring MD:          Jearld Fenton (Referring MD) Medicines:             Propofol per Anesthesia Complications:         No immediate complications. Procedure:             Pre-Anesthesia Assessment:                        - Prior to the procedure, a History and Physical was                         performed, and patient medications and allergies were                         reviewed. The patient's tolerance of previous                         anesthesia was also reviewed. The risks and benefits                         of the procedure and the sedation options and risks                         were discussed with the patient. All questions were                         answered, and informed consent was obtained. Prior                         Anticoagulants: The patient has taken no previous                         anticoagulant or antiplatelet agents. ASA Grade                         Assessment: II - A patient with mild systemic disease.                         After reviewing the risks and benefits, the patient                         was deemed in satisfactory condition to undergo the                         procedure.                        After obtaining informed consent, the colonoscope was  passed under direct vision. Throughout the procedure,                         the patient's blood pressure, pulse, and oxygen                         saturations were monitored continuously. The                         Colonoscope was  introduced through the anus and                         advanced to the the cecum, identified by appendiceal                         orifice and ileocecal valve. The colonoscopy was                         performed without difficulty. The patient tolerated                         the procedure well. The quality of the bowel                         preparation was excellent. Findings:      The perianal and digital rectal examinations were normal.      A 2 mm polyp was found in the ascending colon. The polyp was sessile.       The polyp was removed with a cold biopsy forceps. Resection and       retrieval were complete.      Two sessile polyps were found in the descending colon. The polyps were 4       to 8 mm in size. These polyps were removed with a cold snare. Resection       and retrieval were complete.      Two sessile polyps were found in the sigmoid colon. The polyps were 5 to       6 mm in size. These polyps were removed with a cold snare. Resection and       retrieval were complete.      Non-bleeding internal hemorrhoids were found during retroflexion. The       hemorrhoids were Grade I (internal hemorrhoids that do not prolapse). Impression:            - One 2 mm polyp in the ascending colon, removed with                         a cold biopsy forceps. Resected and retrieved.                        - Two 4 to 8 mm polyps in the descending colon,                         removed with a cold snare. Resected and retrieved.                        - Two 5 to 6 mm polyps in the sigmoid colon, removed  with a cold snare. Resected and retrieved.                        - Non-bleeding internal hemorrhoids. Recommendation:        - Discharge patient to home.                        - Resume previous diet.                        - Continue present medications.                        - Await pathology results.                        - If the pathology report reveals  adenomatous tissue,                         then repeat the colonoscopy for surveillance in 3                         years. Procedure Code(s):     --- Professional ---                        [phone], Colonoscopy, flexible; with removal of                         tumor(s), polyp(s), or other lesion(s) by snare                         technique                        45380, 7, Colonoscopy, flexible; with biopsy, single                         or multiple Diagnosis Code(s):     --- Professional ---                        Z12.11, Encounter for screening for malignant neoplasm                         of colon                        K63.5, Polyp of colon CPT copyright 2019 American Medical Association. All rights reserved. The codes documented in this report are preliminary and upon coder review may  be revised to meet current compliance requirements. Lucilla Lame MD, MD 03/11/2022 9:31:00 AM This report has been signed electronically. Number of Addenda: 0 Note Initiated On: 03/11/2022 8:58 AM Scope Withdrawal Time: 0 hours 9 minutes 50 seconds  Total Procedure Duration: 0 hours 14 minutes 24 seconds  Estimated Blood Loss:  Estimated blood loss: none.      [facility]

## 2022-03-11 NOTE — H&P (Signed)
Melvin Lame, MD [address] [place], [place] [address] Phone: [phone] Fax : [phone]  Primary Care Physician:  Jearld Fenton, NP Primary Gastroenterologist:  Dr. Allen Norris  Pre-Procedure History & Physical: HPI:  Melvin Ramos is a 56 y.o. male is here for a screening colonoscopy.   Past Medical History:  Diagnosis Date   COPD (chronic obstructive pulmonary disease) ([place])    Hemorrhoid     Past Surgical History:  Procedure Laterality Date   TYMPANOSTOMY  08/11/2018    Prior to Admission medications   Medication Sig Start Date End Date Taking? Authorizing Provider  albuterol (VENTOLIN HFA) 108 (90 Base) MCG/ACT inhaler Inhale 1-2 puffs into the lungs every 6 (six) hours as needed for wheezing or shortness of breath. 07/01/20  Yes Malfi, Lupita Raider, FNP  atorvastatin (LIPITOR) 10 MG tablet Take 1 tablet (10 mg total) by mouth daily. 02/11/22  Yes Baity, Coralie Keens, NP  ipratropium-albuterol (DUONEB) 0.5-2.5 (3) MG/3ML SOLN SMARTSIG:3 Milliliter(s) Via Nebulizer 4 Times Daily PRN 05/04/21  Yes [provider]  TRELEGY ELLIPTA 100-62.5-25 MCG/INH AEPB Inhale 1 puff into the lungs daily. 04/15/20  Yes Olin Hauser, DO    Allergies as of 02/10/2022   (No Known Allergies)    Family History  Problem Relation Age of Onset   Depression Mother    Heart disease Mother    Stroke Mother     Social History   Socioeconomic History   Marital status: Single    Spouse name: Not on file   Number of children: Not on file   Years of education: Not on file   Highest education level: Not on file  Occupational History   Not on file  Tobacco Use   Smoking status: Former    Packs/day: 2.00    Years: 40.00    Total pack years: 80.00    Types: Cigarettes    Quit date: 07/09/2019    Years since quitting: 2.6   Smokeless tobacco: Never  Vaping Use   Vaping Use: Never used  Substance and Sexual Activity   Alcohol use: Yes    Comment: "sometimes"    Drug use: No   Sexual activity: Not on file    Comment: not asked  Other Topics Concern   Not on file  Social History Narrative   Not on file   Social Determinants of Health   Financial Resource Strain: Not on file  Food Insecurity: Not on file  Transportation Needs: Not on file  Physical Activity: Not on file  Stress: Not on file  Social Connections: Not on file  Intimate Partner Violence: Not on file    Review of Systems: See HPI, otherwise negative ROS  Physical Exam: BP (!) 139/91   Pulse 68   Temp 97.9 F (36.6 C) (Temporal)   Ht '5\' 6"'$  (1.676 m)   Wt 60.8 kg   SpO2 95%   BMI 21.63 kg/m  General:   Alert,  pleasant and cooperative in NAD Head:  Normocephalic and atraumatic. Neck:  Supple; no masses or thyromegaly. Lungs:  Clear throughout to auscultation.    Heart:  Regular rate and rhythm. Abdomen:  Soft, nontender and nondistended. Normal bowel sounds, without guarding, and without rebound.   Neurologic:  Alert and  oriented x4;  grossly normal neurologically.  Impression/Plan: Melvin Ramos is now here to undergo a screening colonoscopy.  Risks, benefits, and alternatives regarding colonoscopy have been reviewed with the patient.  Questions have been answered.  All parties agreeable.

## 2022-03-11 NOTE — Anesthesia Preprocedure Evaluation (Signed)
Anesthesia Evaluation  Patient identified by MRN, date of birth, ID band Patient awake    Reviewed: Allergy & Precautions, NPO status , Patient's Chart, lab work & pertinent test results  Airway Mallampati: III  TM Distance: >3 FB Neck ROM: full    Dental  (+) Chipped, Dental Advidsory Given   Pulmonary shortness of breath and with exertion, COPD,  COPD inhaler, former smoker,    Pulmonary exam normal        Cardiovascular (-) Past MI and (-) CABG negative cardio ROS Normal cardiovascular exam     Neuro/Psych negative neurological ROS  negative psych ROS   GI/Hepatic negative GI ROS, Neg liver ROS,   Endo/Other  negative endocrine ROS  Renal/GU negative Renal ROS  negative genitourinary   Musculoskeletal   Abdominal   Peds  Hematology negative hematology ROS (+)   Anesthesia Other Findings Past Medical History: No date: COPD (chronic obstructive pulmonary disease) ([facility]) No date: Hemorrhoid  Past Surgical History: 08/11/2018: TYMPANOSTOMY  BMI    Body Mass Index: 21.63 kg/m      Reproductive/Obstetrics negative OB ROS                             Anesthesia Physical Anesthesia Plan  ASA: 3  Anesthesia Plan: General   Post-op Pain Management: Minimal or no pain anticipated   Induction: Intravenous  PONV Risk Score and Plan: 3 and Propofol infusion, TIVA and Ondansetron  Airway Management Planned: Nasal Cannula  Additional Equipment: None  Intra-op Plan:   Post-operative Plan:   Informed Consent: I have reviewed the patients History and Physical, chart, labs and discussed the procedure including the risks, benefits and alternatives for the proposed anesthesia with the patient or authorized representative who has indicated his/her understanding and acceptance.     Dental advisory given  Plan Discussed with: CRNA and Surgeon  Anesthesia Plan Comments: (Discussed  risks of anesthesia with patient, including possibility of difficulty with spontaneous ventilation under anesthesia necessitating airway intervention, PONV, and rare risks such as cardiac or respiratory or neurological events, and allergic reactions. Discussed the role of CRNA in patient's perioperative care. Patient understands.)        Anesthesia Quick Evaluation

## 2022-03-12 ENCOUNTER — Encounter: Payer: Self-pay | Admitting: Gastroenterology

## 2022-03-15 ENCOUNTER — Ambulatory Visit
Admission: RE | Admit: 2022-03-15 | Discharge: 2022-03-15 | Disposition: A | Discharge status: Home / Self Care | Payer: Commercial Managed Care - PPO | Source: Ambulatory Visit | Attending: Pulmonary Disease | Admitting: Pulmonary Disease

## 2022-03-15 DIAGNOSIS — I209 Angina pectoris, unspecified: Secondary | ICD-10-CM

## 2022-03-15 LAB — SURGICAL PATHOLOGY

## 2022-03-16 ENCOUNTER — Encounter: Payer: Self-pay | Admitting: Gastroenterology

## 2022-05-04 ENCOUNTER — Ambulatory Visit (INDEPENDENT_AMBULATORY_CARE_PROVIDER_SITE_OTHER): Payer: Commercial Managed Care - PPO | Admitting: Internal Medicine

## 2022-05-04 ENCOUNTER — Encounter: Payer: Self-pay | Admitting: Internal Medicine

## 2022-05-04 VITALS — BP 128/74 | HR 72 | Temp 97.1°F | Wt 141.0 lb

## 2022-05-04 DIAGNOSIS — M65341 Trigger finger, right ring finger: Secondary | ICD-10-CM | POA: Diagnosis not present

## 2022-05-04 NOTE — Patient Instructions (Signed)
Trigger Finger  Trigger finger, also called stenosing tenosynovitis,  is a condition that causes a finger to get stuck in a bent position. Each finger has a tendon, which is a tough, cord-like tissue that connects muscle to bone, and each tendon passes through a tunnel of tissue called a tendon sheath. To move your finger, your tendon needs to glide freely through the sheath. Trigger finger happens when the tendon or the sheath thickens, making it difficult to move your finger. Trigger finger can affect any finger or a thumb. It may affect more than one finger. Mild cases may clear up with rest and medicine. Severe cases require more treatment. What are the causes? Trigger finger is caused by a thickened finger tendon or tendon sheath. The cause of this thickening is not known. What increases the risk? The following factors may make you more likely to develop this condition: Doing activities that require a strong grip. Having rheumatoid arthritis, gout, or diabetes. Being 40-60 years old. Being male. What are the signs or symptoms? Symptoms of this condition include: Pain when bending or straightening your finger. Tenderness or swelling where your finger attaches to the palm of your hand. A lump in the palm of your hand or on the inside of your finger. Hearing a noise like a pop or a snap when you try to straighten your finger. Feeling a catching or locking sensation when you try to straighten your finger. Being unable to straighten your finger. How is this diagnosed? This condition is diagnosed based on your symptoms and a physical exam. How is this treated? This condition may be treated by: Resting your finger and avoiding activities that make symptoms worse. Wearing a finger splint to keep your finger extended. Taking NSAIDs, such as ibuprofen, to relieve pain and swelling. Doing gentle exercises to stretch the finger as told by your health care provider. Having medicine that reduces  swelling and inflammation (steroids) injected into the tendon sheath. Injections may need to be repeated. Having surgery to open the tendon sheath. This may be done if other treatments do not work and you cannot straighten your finger. You may need physical therapy after surgery. Follow these instructions at home: If you have a splint: Wear the splint as told by your health care provider. Remove it only as told by your health care provider. Loosen it if your fingers tingle, become numb, or turn cold and blue. Keep it clean. If the splint is not waterproof: Do not let it get wet. Cover it with a watertight covering when you take a bath or shower. Managing pain, stiffness, and swelling     If directed, apply heat to the affected area as often as told by your health care provider. Use the heat source that your health care provider recommends, such as a moist heat pack or a heating pad. Place a towel between your skin and the heat source. Leave the heat on for 20-30 minutes. Remove the heat if your skin turns bright red. This is especially important if you are unable to feel pain, heat, or cold. You may have a greater risk of getting burned. If directed, put ice on the painful area. To do this: If you have a removable splint, remove it as told by your health care provider. Put ice in a plastic bag. Place a towel between your skin and the bag or between your splint and the bag. Leave the ice on for 20 minutes, 2-3 times a day.  Activity Rest   your finger as told by your health care provider. Avoid activities that make the pain worse. Return to your normal activities as told by your health care provider. Ask your health care provider what activities are safe for you. Do exercises as told by your health care provider. Ask your health care provider when it is safe to drive if you have a splint on your hand. General instructions Take over-the-counter and prescription medicines only as told by  your health care provider. Keep all follow-up visits as told by your health care provider. This is important. Contact a health care provider if: Your symptoms are not improving with home care. Summary Trigger finger, also called stenosing tenosynovitis, causes your finger to get stuck in a bent position. This can make it difficult and painful to straighten your finger. This condition develops when a finger tendon or tendon sheath thickens. Treatment may include resting your finger, wearing a splint, and taking medicines. In severe cases, surgery to open the tendon sheath may be needed. This information is not intended to replace advice given to you by your health care provider. Make sure you discuss any questions you have with your health care provider. Document Revised: 10/09/2018 Document Reviewed: 10/09/2018 Elsevier Patient Education  2023 Elsevier Inc.  

## 2022-05-04 NOTE — Progress Notes (Signed)
Subjective:    Patient ID: Melvin Ramos, male    DOB: 14-May-1966, 56 y.o.   MRN: [id number]  HPI  Patient presents to clinic today with complaint of a trigger finger of his right hand.  He noticed this 1 month ago. He reports his right ring finger locks in a flexed position. He has significant pain and popping when he tries to extend to his right ring finger.  He denies numbness, tingling or weakness.  He has taken Tylenol OTC with minimal relief of symptoms.  Review of Systems     Past Medical History:  Diagnosis Date   COPD (chronic obstructive pulmonary disease) ([facility])    Hemorrhoid     Current Outpatient Medications  Medication Sig Dispense Refill   albuterol (VENTOLIN HFA) 108 (90 Base) MCG/ACT inhaler Inhale 1-2 puffs into the lungs every 6 (six) hours as needed for wheezing or shortness of breath. 8.5 g 2   atorvastatin (LIPITOR) 10 MG tablet Take 1 tablet (10 mg total) by mouth daily. 90 tablet 1   ipratropium-albuterol (DUONEB) 0.5-2.5 (3) MG/3ML SOLN SMARTSIG:3 Milliliter(s) Via Nebulizer 4 Times Daily PRN     TRELEGY ELLIPTA 100-62.5-25 MCG/INH AEPB Inhale 1 puff into the lungs daily. 1 each 0   No current facility-administered medications for this visit.    No Known Allergies  Family History  Problem Relation Age of Onset   Depression Mother    Heart disease Mother    Stroke Mother     Social History   Socioeconomic History   Marital status: Single    Spouse name: Not on file   Number of children: Not on file   Years of education: Not on file   Highest education level: Not on file  Occupational History   Not on file  Tobacco Use   Smoking status: Former    Packs/day: 2.00    Years: 40.00    Total pack years: 80.00    Types: Cigarettes    Quit date: 07/09/2019    Years since quitting: 2.8   Smokeless tobacco: Never  Vaping Use   Vaping Use: Never used  Substance and Sexual Activity   Alcohol use: Yes    Comment: "sometimes"   Drug use: No   Sexual  activity: Not on file    Comment: not asked  Other Topics Concern   Not on file  Social History Narrative   Not on file   Social Determinants of Health   Financial Resource Strain: Not on file  Food Insecurity: Not on file  Transportation Needs: Not on file  Physical Activity: Not on file  Stress: Not on file  Social Connections: Not on file  Intimate Partner Violence: Not on file     Constitutional: Denies fever, malaise, fatigue, headache or abrupt weight changes.  Respiratory: Denies difficulty breathing, shortness of breath, cough or sputum production.   Cardiovascular: Denies chest pain, chest tightness, palpitations or swelling in the hands or feet.  Musculoskeletal: Patient reports trigger finger right hand.  Denies difficulty with gait, muscle pain or joint  swelling.  Skin: Denies redness, rashes, lesions or ulcercations.  Neurological: Denies numbness, tingling, weakness or problems with coordination.   No other specific complaints in a complete review of systems (except as listed in HPI above).  Objective:   Physical Exam  BP 128/74 (BP Location: Right Arm, Patient Position: Sitting, Cuff Size: Normal)   Pulse 72   Temp (!) 97.1 F (36.2 C) (Temporal)   Wt 141  lb (64 kg)   SpO2 100%   BMI 22.76 kg/m   Wt Readings from Last 3 Encounters:  03/11/22 134 lb (60.8 kg)  02/09/22 143 lb (64.9 kg)  08/26/21 142 lb (64.4 kg)    General: Appears his stated age, well developed, well nourished in NAD. Skin: Warm, dry and intact.  Cardiovascular: Normal rate and rhythm.  Radial pulse 2+ on the right. Pulmonary/Chest: Normal effort and positive vesicular breath sounds. No respiratory distress. No wheezes, rales or ronchi noted.  Musculoskeletal: Normal flexion of the right ring fingers. Clicking at the PIP right ring finger with extension.  There is no noted fibrosis of the tendon at the palm.  Handgrips equal. Neurological: Alert and oriented.    BMET     Component Value Date/Time   NA 142 02/09/2022 1023   K 4.5 02/09/2022 1023   CL 107 02/09/2022 1023   CO2 26 02/09/2022 1023   GLUCOSE 79 02/09/2022 1023   BUN 18 02/09/2022 1023   CREATININE 0.93 02/09/2022 1023   CALCIUM 9.4 02/09/2022 1023   GFRNONAA 101 12/25/2019 0844   GFRAA 117 12/25/2019 0844    Lipid Panel     Component Value Date/Time   CHOL 201 (H) 02/09/2022 1023   TRIG 220 (H) 02/09/2022 1023   HDL 49 02/09/2022 1023   CHOLHDL 4.1 02/09/2022 1023   LDLCALC 118 (H) 02/09/2022 1023    CBC    Component Value Date/Time   WBC 5.2 02/09/2022 1023   RBC 4.96 02/09/2022 1023   HGB 15.5 02/09/2022 1023   HCT 46.7 02/09/2022 1023   PLT 191 02/09/2022 1023   MCV 94.2 02/09/2022 1023   MCH 31.3 02/09/2022 1023   MCHC 33.2 02/09/2022 1023   RDW 12.8 02/09/2022 1023   LYMPHSABS 2,059 12/25/2019 0844   MONOABS 0.6 07/09/2019 1127   EOSABS 330 12/25/2019 0844   BASOSABS 30 12/25/2019 0844    Hgb A1C Lab Results  Component Value Date   HGBA1C 5.3 02/09/2022           Assessment & Plan:   Trigger Finger, Right Ring Finger:  Referral to hand surgery for further evaluation and treatment Recommend Ibuprofen 400 mg every 8 hours as needed, consume with food  RTC in 4 months for follow-up of chronic conditions Webb Silversmith, NP

## 2022-05-06 ENCOUNTER — Ambulatory Visit: Payer: Commercial Managed Care - PPO | Admitting: Internal Medicine

## 2022-05-13 ENCOUNTER — Other Ambulatory Visit: Payer: Commercial Managed Care - PPO

## 2022-05-14 LAB — COMPLETE METABOLIC PANEL WITH GFR
AG Ratio: 1.5 (calc) (ref 1.0–2.5)
ALT: 32 U/L (ref 9–46)
AST: 25 U/L (ref 10–35)
Albumin: 4.5 g/dL (ref 3.6–5.1)
Alkaline phosphatase (APISO): 56 U/L (ref 35–144)
BUN: 11 mg/dL (ref 7–25)
CO2: 27 mmol/L (ref 20–32)
Calcium: 10 mg/dL (ref 8.6–10.3)
Chloride: 105 mmol/L (ref 98–110)
Creat: 0.9 mg/dL (ref 0.70–1.30)
Globulin: 3 g/dL (calc) (ref 1.9–3.7)
Glucose, Bld: 86 mg/dL (ref 65–99)
Potassium: 4.2 mmol/L (ref 3.5–5.3)
Sodium: 140 mmol/L (ref 135–146)
Total Bilirubin: 0.5 mg/dL (ref 0.2–1.2)
Total Protein: 7.5 g/dL (ref 6.1–8.1)
eGFR: 99 mL/min/{1.73_m2} (ref >=60)

## 2022-05-14 LAB — LIPID PANEL
Cholesterol: 226 mg/dL — ABNORMAL HIGH (ref <200)
HDL: 50 mg/dL (ref >=40)
LDL Cholesterol (Calc): 136 mg/dL (calc) — ABNORMAL HIGH
Non-HDL Cholesterol (Calc): 176 mg/dL (calc) — ABNORMAL HIGH (ref <130)
Total CHOL/HDL Ratio: 4.5 (calc) (ref <5.0)
Triglycerides: 260 mg/dL — ABNORMAL HIGH (ref <150)

## 2022-05-19 ENCOUNTER — Other Ambulatory Visit: Payer: Self-pay

## 2022-05-19 DIAGNOSIS — E782 Mixed hyperlipidemia: Secondary | ICD-10-CM

## 2022-05-19 MED ORDER — ATORVASTATIN CALCIUM 10 MG PO TABS: 10.0000 mg | ORAL_TABLET | Freq: Every day | ORAL | 1 refills | Status: DC

## 2022-06-18 IMAGING — DX DG CHEST 2V
2 series · 2 of 2 positions shown · non-contrast
Comparison: 07/09/2019

CLINICAL DATA: 55-year-old male with a history of chest pain and
back pain

EXAM:
CHEST - 2 VIEW

[chest pa]
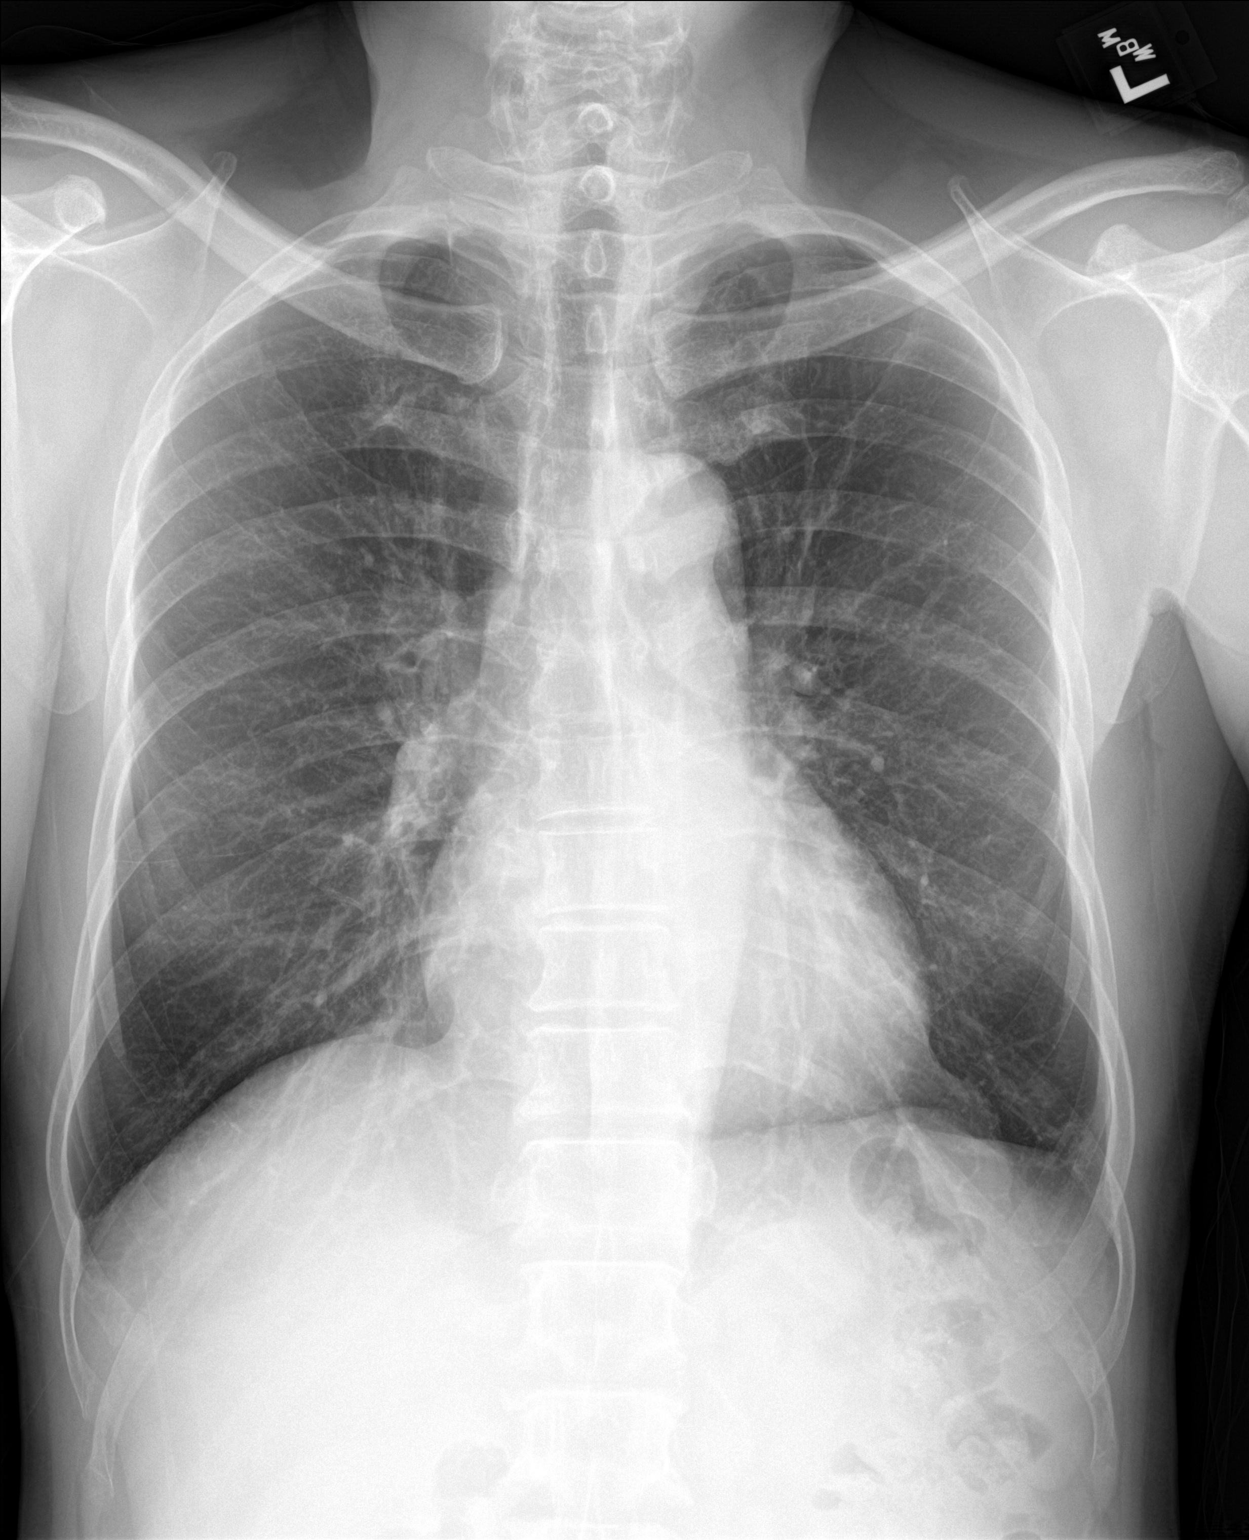

[chest lat]
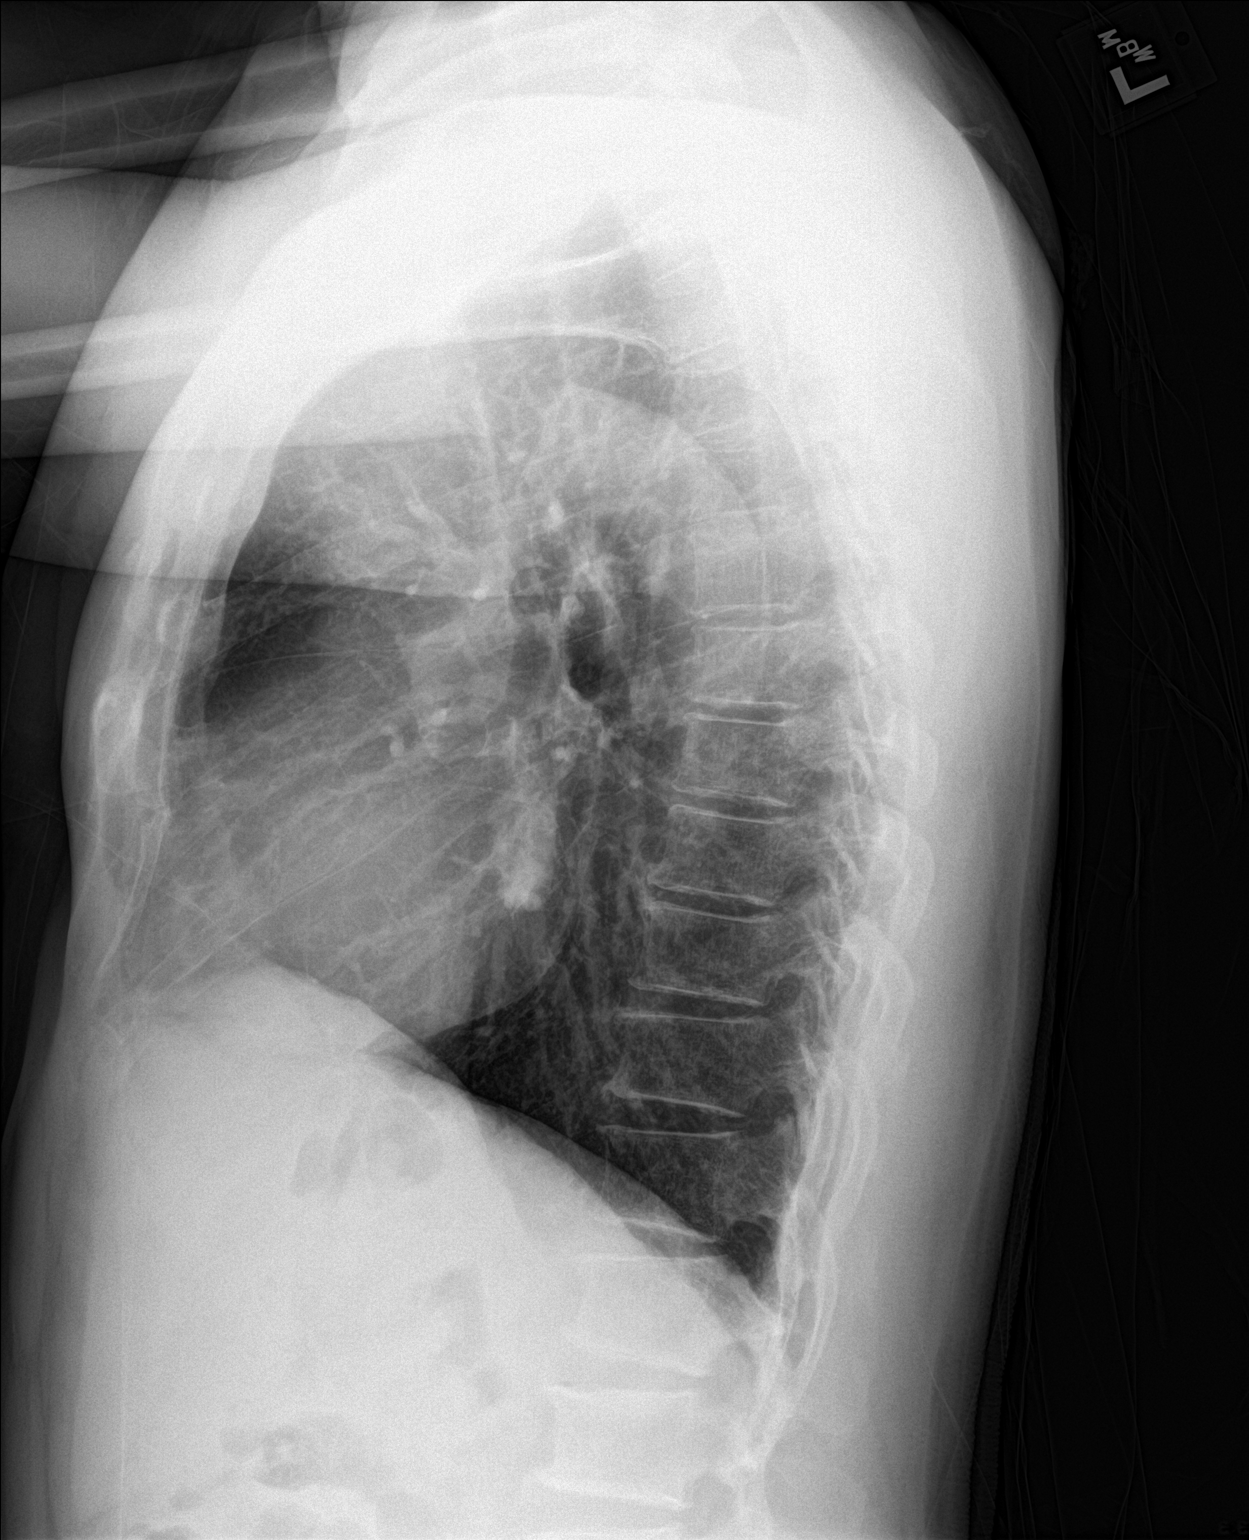

[2 of 2 positions shown; findings below may reference images not displayed]

FINDINGS: Cardiomediastinal silhouette unchanged in size and contour. No
evidence of central vascular congestion. No interlobular septal
thickening.

No pneumothorax or pleural effusion. Coarsened interstitial
markings, with no confluent airspace disease.

No acute displaced fracture. Degenerative changes of the spine.
IMPRESSION: No active cardiopulmonary disease.

## 2022-08-05 ENCOUNTER — Encounter: Payer: Self-pay | Admitting: Internal Medicine

## 2022-08-05 ENCOUNTER — Ambulatory Visit: Payer: Commercial Managed Care - PPO | Admitting: Internal Medicine

## 2022-08-05 VITALS — BP 130/80 | HR 69 | Temp 96.6°F | Wt 146.0 lb

## 2022-08-05 DIAGNOSIS — M159 Polyosteoarthritis, unspecified: Secondary | ICD-10-CM

## 2022-08-05 DIAGNOSIS — J410 Simple chronic bronchitis: Secondary | ICD-10-CM | POA: Diagnosis not present

## 2022-08-05 DIAGNOSIS — E782 Mixed hyperlipidemia: Secondary | ICD-10-CM | POA: Diagnosis not present

## 2022-08-05 DIAGNOSIS — R7309 Other abnormal glucose: Secondary | ICD-10-CM

## 2022-08-05 DIAGNOSIS — M1990 Unspecified osteoarthritis, unspecified site: Secondary | ICD-10-CM | POA: Insufficient documentation

## 2022-08-05 NOTE — Assessment & Plan Note (Signed)
Continue Tylenol or ibuprofen OTC as needed You can follow-up with orthopedics for another injection if your shoulder pain worsens

## 2022-08-05 NOTE — Progress Notes (Signed)
Subjective:    Patient ID: Melvin Ramos, male    DOB: 1966/06/07, 57 y.o.   MRN: [id number]  HPI  Pt presents to the clinic today for follow up chronic conditions.  COPD: He reports chronic cough but denies shortness of breath. He is using Trelegy and Albuterol as prescribed. He uses his nebulizer machine as needed There is no PFT's on file. He has quit smoking. He follows with pulmonology.  OA: Mainly in his right shoulder and elbow. He takes Tylenol or Ibuprofen as needed with some relief of symptoms. He has had a cortisone injection in his right shoulder in the past. He follows with orthopedics.  HLD: HIs last LDL was 136, triglycerides 260, 05/2022. He denies myalgias on Atorvastatin. He does not consume a low fat diet.  Review of Systems     Past Medical History:  Diagnosis Date   COPD (chronic obstructive pulmonary disease) (HCC)    Hemorrhoid     Current Outpatient Medications  Medication Sig Dispense Refill   albuterol (VENTOLIN HFA) 108 (90 Base) MCG/ACT inhaler Inhale 1-2 puffs into the lungs every 6 (six) hours as needed for wheezing or shortness of breath. 8.5 g 2   atorvastatin (LIPITOR) 10 MG tablet Take 1 tablet (10 mg total) by mouth daily. 90 tablet 1   ipratropium-albuterol (DUONEB) 0.5-2.5 (3) MG/3ML SOLN SMARTSIG:3 Milliliter(s) Via Nebulizer 4 Times Daily PRN     TRELEGY ELLIPTA 100-62.5-25 MCG/INH AEPB Inhale 1 puff into the lungs daily. 1 each 0   No current facility-administered medications for this visit.    No Known Allergies  Family History  Problem Relation Age of Onset   Depression Mother    Heart disease Mother    Stroke Mother     Social History   Socioeconomic History   Marital status: Single    Spouse name: Not on file   Number of children: Not on file   Years of education: Not on file   Highest education level: Not on file  Occupational History   Not on file  Tobacco Use   Smoking status: Former    Packs/day: 2.00    Years:  40.00    Total pack years: 80.00    Types: Cigarettes    Quit date: 07/09/2019    Years since quitting: 3.0   Smokeless tobacco: Never  Vaping Use   Vaping Use: Never used  Substance and Sexual Activity   Alcohol use: Yes    Comment: "sometimes"   Drug use: No   Sexual activity: Not on file    Comment: not asked  Other Topics Concern   Not on file  Social History Narrative   Not on file   Social Determinants of Health   Financial Resource Strain: Not on file  Food Insecurity: Not on file  Transportation Needs: Not on file  Physical Activity: Not on file  Stress: Not on file  Social Connections: Not on file  Intimate Partner Violence: Not on file     Constitutional: Denies fever, malaise, fatigue, headache or abrupt weight changes.  HEENT: Denies eye pain, eye redness, ear pain, ringing in the ears, wax buildup, runny nose, nasal congestion, bloody nose, or sore throat. Respiratory: Pt reports cough. Denies difficulty breathing, shortness of breath, or sputum production.   Cardiovascular: Denies chest pain, chest tightness, palpitations or swelling in the hands or feet.  Gastrointestinal: Denies abdominal pain, bloating, constipation, diarrhea or blood in the stool.  GU: Denies urgency, frequency, pain with urination,  burning sensation, blood in urine, odor or discharge. Musculoskeletal: Pt reports intermittent shoulder/elbow pain. Denies decrease in range of motion, difficulty with gait, muscle pain or joint swelling.  Skin: Denies redness, rashes, lesions or ulcercations.  Neurological: Denies dizziness, difficulty with memory, difficulty with speech or problems with balance and coordination.  Psych: Denies anxiety, depression, SI/HI.  No other specific complaints in a complete review of systems (except as listed in HPI above).  Objective:   Physical Exam   BP 130/80 (BP Location: Left Arm, Patient Position: Sitting, Cuff Size: Normal)   Pulse 69   Temp (!) 96.6 F  (35.9 C) (Temporal)   Wt 146 lb (66.2 kg)   SpO2 99%   BMI 23.57 kg/m   Wt Readings from Last 3 Encounters:  05/04/22 141 lb (64 kg)  03/11/22 134 lb (60.8 kg)  02/09/22 143 lb (64.9 kg)    General: Appears his stated age, well developed, well nourished in NAD. Skin: Warm, dry and intact.  HEENT: Head: normal shape and size; Eyes: sclera white, no icterus, conjunctiva pink, PERRLA and EOMs intact;  Cardiovascular: Normal rate and rhythm. S1,S2 noted.  No murmur, rubs or gallops noted. No JVD or BLE edema. No carotid bruits noted. Pulmonary/Chest: Normal effort and positive vesicular breath sounds. No respiratory distress. No wheezes, rales or ronchi noted.  Musculoskeletal: Normal internal and external rotation of the right shoulder.  Pain with palpation over the right AC joint and right proximal anterior biceps tendon.  Positive drop can test on the left.  Strength 5/5 BUE.  No difficulty with gait.  Neurological: Alert and oriented. Coordination normal.  Psychiatric: Mood and affect normal. Behavior is normal. Judgment and thought content normal.    BMET    Component Value Date/Time   NA 140 05/13/2022 0924   K 4.2 05/13/2022 0924   CL 105 05/13/2022 0924   CO2 27 05/13/2022 0924   GLUCOSE 86 05/13/2022 0924   BUN 11 05/13/2022 0924   CREATININE 0.90 05/13/2022 0924   CALCIUM 10.0 05/13/2022 0924   GFRNONAA 101 12/25/2019 0844   GFRAA 117 12/25/2019 0844    Lipid Panel     Component Value Date/Time   CHOL 226 (H) 05/13/2022 0924   TRIG 260 (H) 05/13/2022 0924   HDL 50 05/13/2022 0924   CHOLHDL 4.5 05/13/2022 0924   LDLCALC 136 (H) 05/13/2022 0924    CBC    Component Value Date/Time   WBC 5.2 02/09/2022 1023   RBC 4.96 02/09/2022 1023   HGB 15.5 02/09/2022 1023   HCT 46.7 02/09/2022 1023   PLT 191 02/09/2022 1023   MCV 94.2 02/09/2022 1023   MCH 31.3 02/09/2022 1023   MCHC 33.2 02/09/2022 1023   RDW 12.8 02/09/2022 1023   LYMPHSABS 2,059 12/25/2019 0844    MONOABS 0.6 07/09/2019 1127   EOSABS 330 12/25/2019 0844   BASOSABS 30 12/25/2019 0844    Hgb A1C Lab Results  Component Value Date   HGBA1C 5.3 02/09/2022           Assessment & Plan:      RTC in 6 months for your annual exam Webb Silversmith, NP

## 2022-08-05 NOTE — Assessment & Plan Note (Signed)
C-Met and lipid profile today Encouraged him to consume a low-fat diet Continue atorvastatin 

## 2022-08-05 NOTE — Assessment & Plan Note (Signed)
Congratulated him on smoking cessation Continue Trelegy, albuterol and nebulizers as prescribed He will continue to follow with pulmonology

## 2022-08-05 NOTE — Patient Instructions (Signed)
K? ho?ch ?n u?ng t?t cho tim Heart-Healthy Eating Plan Nhi?u y?u t? ?nh h??ng ??n s?c kh?e c?a tim qu v?, bao g?m thi quen ?n u?ng v t?p luy?n. S?c kh?e tim m?ch cn ???c g?i l s?c kh?e m?ch vnh. Nguy c? b? b?nh m?ch vnh t?ng ln cng v?i n?ng ?? ch?t bo (lipid) b?t th??ng trong mu. K? ho?ch ?n c l?i cho tim bao g?m gi?i h?n cc ch?t bo khng t?t cho s?c kh?e, t?ng cc ch?t bo t?t cho s?c kh?e, h?n ch? mu?i (natri) v th?c hi?n cc thay ??i khc v? ch? ?? ?n v l?i s?ng. K? ho?ch c?a ti l g? Chuyn gia ch?m [place] s?c kh?e c th? khuy?n ngh?: Qu v? h?n ch? l??ng ch?t bo tiu th? ?? m??c t? _________% t?ng l??ng calo m?i ngy tr? xu?ng. Qu v? h?n ch? l??ng ch?t bo bo ha ?? m??c t? _________% t?ng l??ng calo m?i ngy tr? xu?ng. Qu v? h?n ch? l??ng cholesterol trong ch? ?? ?n u?ng c?a quy? vi? ?? m??c d??i _________ mg m?i ngy. Qu v? h?n ch? l??ng natri trong ch? ?? ?n u?ng c?a quy? vi? ?? m??c d??i _________ mg m?i ngy. C nh?ng l?i khuyn no ?? tun th? k? ho?ch ny? N?u N?u ?? ?n b?ng cc ph??ng php khc thay vi? chin/rn. Bo? lo?, lu?c, n??ng v hun, t?t c? ??u l nh??ng l?a ch?n t?t. Nh?ng cch khc ?? gi?m ch?t bo bao g?m: Lo?i b? da kh?i th?t da c?m. Lo?i b? t?t c? ph?n m? nhn th?y ra kh?i th?t. H?p rau c? b?ng n??c ho?c n??c dng. Ln k? ho?ch cho b?a ?n  Trong b?a ?n, hy hnh dung vi?c chia ??a th?c ?n ra thnh b?n ph?n: Cho rau v sa lt rau xanh va?o m?t n?a ??a ??ng th?c ?n. Cho ngu? c?c nguyn ca?m va?o m?t ph?n t? ??a. Cho th?c ?n c protein thi?t na?c va?o m?t ph?n t? ??a. ?n 2-4 c?c rau c? m?i ngy. M?t c?c rau c? t??ng ???ng v?i 1 c?c (91 g) bng c?i xanh hay hoa sp l?, 2 c? c r?t c? v?a, 1 ?t chung c? l?n, 1 c? khoai lang l?n, 1 qu? c chua l?n, 1 c? khoai ty tr?ng c? v?a, 2 c?c (150 g) rau l t??i s?ng. ?n 1-2 c?c tri cy m?i ngy. M?t c?c tri cy t??ng ???ng v?i 1 qu? to nh?, 1 qu? chu?i l?n, 1 c?c (237 g) tri cy tr?n, 1 qu? cam l?n,   c?c (82 g) tri cy kh, 1 c?c (240 mL) n??c p tri cy 100%. ?n nhi?u th?c ph?m c ch?t x? ha tan. V d? bao g?m to, bng c?i xanh, c r?t, ??u h?t, ??u H Lan v la m?ch. ???t mu?c tiu ?n 25-30 g ch?t x? m?i ngy. T?ng tiu th? qu? ??u, qu? h?ch v cc lo?i h?t ln 4-5 kh?u ph?n m?i tu?n. M?t kh?u ph?n ??u kh ho?c qu? ??u t??ng ???ng 1?4 c?c (90 g) n?u chn, 1 kh?u ph?n qu? h?ch l  oz (12 qu? h?nh nhn, 24 qu? h? tr?n ho?c 7 n?a qu? c ch) v 1 kh?u ph?n h?t t??ng ???ng  oz (8 g). Ch?t bo Ch?n cc ch?t bo t?t cho s?c kh?e th??ng xuyn h?n. Ch?n ch?t bo khng bo ha ??n v ch?t be?o khng ba?o ho?a ?a, ch?ng h?n nh? d?u  liu v d?u canola, d?u b?, h?t lanh, qu? c ch, h?nh nhn v cc lo?i h?t. ?n thm  ch?t bo omega-3. Ch?n c h?i, c thu, c mi, c ng?, d?u h?t lanh va? h?t lanh nghi?n. ???t mu?c tiu ?n c t nh?t 2 l?n m?i tu?n. Ki?m tra nhn th?c ph?m c?n th?n ?? nh?n bi?t th?c ph?m c ch?t bo chuy?n ha ho?c c hm l??ng ch?t bo bo ha cao. H?n ch? cc ch?t bo bo ha. Nh?ng ch?t bo ny ???c tm th?y trong cc s?n ph?m ??ng v?t, nh? th?t, b? v kem. Ca?c ngu?n ch?t bo bo ha t?? th??c v?t bao g?m d?u c?, d?u h?t c? v d?u d?a. Hessie Diener cc th?c ph?m co? cc lo?i d?u hydro ha m?t ph?n. Nh?ng th?c ph?m ny c ch?t bo chuy?n ha. V d? bao g?m b? th?c v?t, m?t s? b? th?c v?t ?o?ng h?p, bnh quy, bnh quy gin v cc lo?i bnh n??ng khc. Trnh cc th?c ph?m chin/rn. Thng tin chung ?n nhi?u th?c ?n n?u t?i nh h?n v ?n i?t th??c ?n ?? nh hng, th??c ?n t? ch?n v th?c ?n nhanh h?n. H?n ch? ho?c trnh u?ng r??u. H?n ch? cc th?c ph?m c nhi?u ???ng b? sung v tinh b?t ??n gi?n nh? th?c ph?m ???c lm b?ng b?t m tr?ng tinh ch? (bnh m tr?ng, bnh ng?t, ?? ng?t). Gi?m cn n?u qu v? th?a cn. Gia?m ch? 5-10% s? cn n?ng c? th? c th? gip ch cho s?c kh?e t?ng th? c?a quy? vi? v ng?n ng?a cc b?nh nh? ti?u ???ng v b?nh tim. Theo di l??ng natri tiu th? c?a qu v?,  ??c bi?t l n?u qu v? b? cao huy?t p. Trao ??i v?i chuyn gia ch?m [place] s?c kh?e v? m?c tiu th? natri c?a qu v?. C? g?ng k?t h?p nhi?u b?a ?n chay h?n m?i tu?n. Ti nn ?n nh?ng lo?i th?c ph?m no? Tri cy T?t c? cc lo?i tra?i cy t??i, ?ng h?p (d???i da?ng n??c p t? nhin) ho?c ?ng l?nh. Marlou Starks c? Marlou Starks c? t??i ho?c ?ng l?nh (s?ng, h?p, bo? lo? ho?c n??ng). Sa lt rau xanh. Ng? c?c H?u h?t cc lo?i ng? c?c. Ch?n b?t m nguyn cm v ng? c?c nguyn cm trong h?u h?t cc l?n ?n. G?o v pasta, bao g?m g?o l?t v pasta b?ng b?t m nguyn cm. Th?t v cc lo?i protein khc Th?t b, th?t b, th?t heo v th?t c?u non n?c, l?ng m? v da. Th?t g v th?t g ty b? da. T?t c? cc loi c v ??ng v?t c v? (tm cua, s h?n). V?t tr?i, th?, g li v h??u nai. Lng tr?ng tr?ng ho?c th?c ph?m thay th? tr?ng t cholesterol. ??u kh, ??u H Lan, ??u l?ng v ??u h?. Cc lo?i h?t v h?u h?t qu? h?ch. S?a Cc lo?i pho mt t ho?c khng c ch?t bo, bao g?m ricotta v mozzarella. S?a g?y ho?c s?a 1% (d?ng l?ng, b?t ho?c s?a ??c). Kem s?a ???c lm b?ng s?a t ch?t bo. S?a chua khng ho?c t bo. M? v d?u B? th?c v?t khng hydro ha (khng c ch?t bo chuy?n ha). D?u th?c v?t, bao g?m d?u ??u nnh, d?u m, d?u h??ng d??ng, d?u  liu, d?u b?, d?u ??u ph?ng, d?u hoa rum, d?u b?p, d?u canola v d?u h?t bng. N??c s?t rau tr?n ho?c s?t mayonnaise lm b?ng d?u th?c v?t. ?? u?ng N??c (khong ho?c c ga). C ph v tr. Tr ? khng ???ng. ?? u?ng ?n king. ?? ng?t v cc mn trng mi?ng Sherbet, gelatin v kem ?  tri cy. L??ng nh? chocolate ?en. Gi?i h?n t?t c? cc lo?i k?o v ?? trng mi?ng. Gia v? T?t c? cc lo?i gia v?. Nh?ng th?c ph?m li?t k ? trn c th? khng ph?i l m?t danh m?c ??y ?? cc lo?i th?c ph?m v ?? u?ng m qu v? c th? ?n. Lin h? v?i chuyn gia dinh d??ng ?? c thm s? l?a ch?n. Ti nn trnh nh?ng th?c ph?m no? Tri cy Tri cy ?ng h?p ngm xi-r ??c. Tri cy ngm trong kem ho?c  n??c x?t b?. Tri cy kh. H?n ch? dng d?a. Rau c? Rau n?u km pho mt, kem, ho?c s?t b?. Rau chin. Ng? c?c 53 m lm b?ng ch?t bo bo ha ho?c ch?t bo chuy?n ha, d?u ho?c s?a nguyn kem. Bnh s?ng b. Cc lo?i bnh cu?n ng?t. Bnh rn. Bnh quy gin nhi?u ch?t bo, ch?ng h?n nh? bnh quy gin c pho mt v khoai ty chin. Th?t v cc lo?i protein khc Th?t nhi?u m?, ch?ng h?n nh? hotdog, s??n, xc xch, th?t xng khi, th?t quay ho?c th?t n??ng. Cc lo?i th?t deli nhi?u m?, ch?ng h?n nh? xc xch  v bologna. Tr?ng c mu?i. Th?t v?t v ng?ng nui. Th?t n?i t?ng, ch?ng h?n nh? gan. S?a Kem, kem chua, pho mt kem v pho mt t??i kem. Pho mt s?a nguyn kem. S?a nguyn kem ho?c s?a 2% (d?ng l?ng, b?t ho?c s?a ??c). Kem s?a nguyn kem. S?t kem ho?c s?t pho mt nhi?u ch?t bo. S?a chua t? s?a nguyn kem. M? v d?u Th?t m?, ho?c ch?t bo t? d?u th?c v?t. B? cacao, d?u hydro ha, d?u c?, d?u d?a, d?u h?t c?. Ch?t bo v m? pha bnh d?ng r?n, bao g?m m? t? th?t xng khi, th?t l?n mu?i, m? l?n v b?. Th?c ph?m thay th? kem khng s?a. N??c tr?n salad c pho mt ho?c kem chua. ?? u?ng N??c soda v cc th?c u?ng thng th??ng c thm ???ng. ?? ng?t v cc mn trng mi?ng Kem trang tr b?ng b? s?a. Bnh pudding. Bnh cookie. Bnh n??ng. Bnh c nhn. Chocolate c s?a ho?c chocolate tr?ng. Si r c b?Salome Spotted ? nguyn ch?t bo ho?c ?? u?ng c kem ?. Nh?ng th?c ph?m li?t k ? trn c th? khng ph?i l m?t danh m?c ??y ?? cc th?c ph?m v ?? u?ng c?n trnh. Hy lin h? v?i chuyn gia dinh d??ng ?? c thm thng tin. Tm t?t Ln k? ho?ch b?a ?n c l?i cho tim bao g?m vi?c gi?i h?n cc ch?t bo khng t?t cho s?c kh?e, t?ng cc ch?t bo t?t cho s?c kh?e, h?n ch? mu?i (natri) v th?c hi?n cc thay ??i khc v? ch? ?? ?n v l?i s?ng. Gi?m cn n?u qu v? th?a cn. Gia?m ch? 5-10% s? cn n?ng c? th? c th? gip i?ch cho s?c kh?e t?ng th? c?a quy? vi? v ng?n ng?a cc b?nh nh? ti?u ???ng v b?nh tim. T?p trung  vo vi?c ?n cn ??i cc th?c ph?m, bao g?m tri cy v rau c?, s?a t bo ho?c khng bo, protein th?t n?c, qu? h?ch v qu? ??u, ng? c?c nguyn cm v d?u v m? t?t cho tim. Thng tin ny khng nh?m m?c ?ch thay th? cho l?i khuyn m chuyn gia ch?m [place] s?c kh?e ni v?i qu v?. Hy b?o ??m qu v? ph?i th?o lu?n b?t k? v?n ?? g m qu v? c v?i chuyn gia ch?m  s?c kh?e c?a qu  v?. Document Revised: 07/30/2021 Document Reviewed: 07/30/2021 Elsevier Patient Education  [place].

## 2022-08-06 LAB — COMPLETE METABOLIC PANEL WITH GFR
AG Ratio: 1.6 (calc) (ref 1.0–2.5)
ALT: 23 U/L (ref 9–46)
AST: 16 U/L (ref 10–35)
Albumin: 4.2 g/dL (ref 3.6–5.1)
Alkaline phosphatase (APISO): 63 U/L (ref 35–144)
BUN: 18 mg/dL (ref 7–25)
CO2: 26 mmol/L (ref 20–32)
Calcium: 8.7 mg/dL (ref 8.6–10.3)
Chloride: 107 mmol/L (ref 98–110)
Creat: 0.87 mg/dL (ref 0.70–1.30)
Globulin: 2.6 g/dL (calc) (ref 1.9–3.7)
Glucose, Bld: 74 mg/dL (ref 65–99)
Potassium: 3.8 mmol/L (ref 3.5–5.3)
Sodium: 140 mmol/L (ref 135–146)
Total Bilirubin: 0.5 mg/dL (ref 0.2–1.2)
Total Protein: 6.8 g/dL (ref 6.1–8.1)
eGFR: 100 mL/min/{1.73_m2} (ref >=60)

## 2022-08-06 LAB — CBC
HCT: 42.5 % (ref 38.5–50.0)
Hemoglobin: 14.7 g/dL (ref 13.2–17.1)
MCH: 31.5 pg (ref 27.0–33.0)
MCHC: 34.6 g/dL (ref 32.0–36.0)
MCV: 91 fL (ref 80.0–100.0)
MPV: 11.6 fL (ref 7.5–12.5)
Platelets: 194 10*3/uL (ref 140–400)
RBC: 4.67 10*6/uL (ref 4.20–5.80)
RDW: 12.4 % (ref 11.0–15.0)
WBC: 4.2 10*3/uL (ref 3.8–10.8)

## 2022-08-06 LAB — LIPID PANEL
Cholesterol: 142 mg/dL (ref <200)
HDL: 35 mg/dL — ABNORMAL LOW (ref >=40)
LDL Cholesterol (Calc): 79 mg/dL (calc)
Non-HDL Cholesterol (Calc): 107 mg/dL (calc) (ref <130)
Total CHOL/HDL Ratio: 4.1 (calc) (ref <5.0)
Triglycerides: 184 mg/dL — ABNORMAL HIGH (ref <150)

## 2022-08-06 LAB — HEMOGLOBIN A1C
Hgb A1c MFr Bld: 5.6 % of total Hgb (ref <5.7)
Mean Plasma Glucose: 114 mg/dL
eAG (mmol/L): 6.3 mmol/L

## 2022-08-12 MED ORDER — FENOFIBRATE 48 MG PO TABS: 48.0000 mg | ORAL_TABLET | Freq: Every day | ORAL | 1 refills | Status: DC

## 2022-08-12 NOTE — Addendum Note (Signed)
Addended by: Jearld Fenton on: 08/12/2022 09:31 AM   Modules accepted: Orders

## 2022-08-27 ENCOUNTER — Ambulatory Visit: Admission: RE | Admit: 2022-08-27 | Payer: Commercial Managed Care - PPO | Source: Ambulatory Visit

## 2022-09-02 ENCOUNTER — Ambulatory Visit
Admission: RE | Admit: 2022-09-02 | Discharge: 2022-09-02 | Disposition: A | Discharge status: Home / Self Care | Payer: Commercial Managed Care - PPO | Source: Ambulatory Visit | Attending: Acute Care | Admitting: Acute Care

## 2022-09-02 ENCOUNTER — Other Ambulatory Visit: Payer: Self-pay | Admitting: *Deleted

## 2022-09-02 DIAGNOSIS — Z87891 Personal history of nicotine dependence: Secondary | ICD-10-CM | POA: Diagnosis not present

## 2022-09-02 DIAGNOSIS — Z122 Encounter for screening for malignant neoplasm of respiratory organs: Secondary | ICD-10-CM

## 2022-09-03 ENCOUNTER — Other Ambulatory Visit: Payer: Self-pay | Admitting: Acute Care

## 2022-09-03 DIAGNOSIS — Z122 Encounter for screening for malignant neoplasm of respiratory organs: Secondary | ICD-10-CM

## 2022-09-03 DIAGNOSIS — Z87891 Personal history of nicotine dependence: Secondary | ICD-10-CM

## 2023-01-07 ENCOUNTER — Ambulatory Visit: Payer: Commercial Managed Care - PPO | Admitting: Physician Assistant

## 2023-01-07 ENCOUNTER — Encounter: Payer: Self-pay | Admitting: Physician Assistant

## 2023-01-07 VITALS — BP 126/84 | HR 85 | Temp 97.1°F | Wt 143.0 lb

## 2023-01-07 DIAGNOSIS — K625 Hemorrhage of anus and rectum: Secondary | ICD-10-CM

## 2023-01-07 DIAGNOSIS — K649 Unspecified hemorrhoids: Secondary | ICD-10-CM

## 2023-01-07 NOTE — Patient Instructions (Addendum)
I recommend increasing your daily fiber intake- do this gradually to prevent bloating and abdominal discomfort. Try to get about 5 grams per day the first week or so then increase by 5 grams each week until you feel like you are having comfortable bowel movements  You can use supplements like Benefiber, Metamucil, fiber gummies, fiber brownies to help increase your fiber intake  You can use Preparation H and witch hazel pads to help with the hemorrhoids

## 2023-01-07 NOTE — Progress Notes (Signed)
Acute Office Visit   Patient: Melvin Ramos   DOB: [birth year]   57 y.o. Male  MRN: [id number] Visit Date: 01/07/2023  Today's healthcare provider: Oswaldo Conroy Thurley Francesconi, PA-C  Introduced myself to the patient as a Secondary school teacher and provided education on APPs in clinical practice.    Chief Complaint  Patient presents with   Rectal Bleeding    Started about two weeks ago.     Subjective    HPI HPI     Rectal Bleeding    Additional comments: Started about two weeks ago.        Last edited by Paschal Dopp, CMA on 01/07/2023  9:12 AM.      Patient is here with his daughter, Tresa Endo to assist with translation He reports that for the past 2 weeks he has had blood with bowel movements He denies bleeding outside of bowel movements  He reports there is some pain with bowel movements and blood is red in color- a bit dark but no dark, black stools  He reports a previous hx of hemorrhoids - he states he has had intervention in the past for this  He has had some recent changes to diet- they were on a cruise for a week and after they got back his symptoms started  They have resumed normal diet of rice and vegetables   Interventions; Nothing     Medications: Outpatient Medications Prior to Visit  Medication Sig   albuterol (VENTOLIN HFA) 108 (90 Base) MCG/ACT inhaler Inhale 1-2 puffs into the lungs every 6 (six) hours as needed for wheezing or shortness of breath.   atorvastatin (LIPITOR) 10 MG tablet Take 1 tablet (10 mg total) by mouth daily.   fenofibrate (TRICOR) 48 MG tablet Take 1 tablet (48 mg total) by mouth daily.   ipratropium-albuterol (DUONEB) 0.5-2.5 (3) MG/3ML SOLN SMARTSIG:3 Milliliter(s) Via Nebulizer 4 Times Daily PRN   TRELEGY ELLIPTA 100-62.5-25 MCG/INH AEPB Inhale 1 puff into the lungs daily.   No facility-administered medications prior to visit.    Review of Systems  Constitutional:  Positive for fatigue. Negative for chills and fever.  Respiratory:  Negative for  shortness of breath.   Gastrointestinal:  Positive for blood in stool and rectal pain. Negative for abdominal pain.  Neurological:  Negative for dizziness and light-headedness.         Objective    BP 126/84 (BP Location: Right Arm, Patient Position: Sitting, Cuff Size: Normal)   Pulse 85   Temp (!) 97.1 F (36.2 C) (Temporal)   Wt 143 lb (64.9 kg)   SpO2 98%   BMI 23.08 kg/m      Physical Exam Vitals reviewed.  Constitutional:      General: He is awake.     Appearance: Normal appearance. He is well-developed and well-groomed.  HENT:     Head: Normocephalic and atraumatic.  Cardiovascular:     Rate and Rhythm: Normal rate and regular rhythm.     Pulses: Normal pulses.     Heart sounds: Normal heart sounds.  Pulmonary:     Effort: Pulmonary effort is normal.     Breath sounds: Normal breath sounds.  Abdominal:     General: Abdomen is flat. Bowel sounds are normal.     Palpations: Abdomen is soft.     Tenderness: There is no abdominal tenderness.  Genitourinary:    Rectum: External hemorrhoid and internal hemorrhoid present. No mass, tenderness or anal fissure.  Comments: Patient declines chaperone  Small external - non-thrombosed or inflamed hemorrhoids noted  Small internal hemorrhoids noted on palpation  Musculoskeletal:     Right lower leg: No edema.     Left lower leg: No edema.  Neurological:     Mental Status: He is alert.  Psychiatric:        Behavior: Behavior is cooperative.       No results found for any visits on 01/07/23.  Assessment & Plan      No follow-ups on file.       Problem List Items Addressed This Visit   None Visit Diagnoses     Hemorrhoids, unspecified hemorrhoid type    -  Primary Acute, recurrent  Patient reports previous hx of hemorrhoids requiring intervention with recent flare of bright red blood per rectum with bowel movements He reports recent dietary changes and thus has been having hard stools with  pain Suspect this has led to constipation and thus aggravated flare of hemorrhoids  Recommend OTC management at this time with preparation H, witch hazel pads and increasing fiber intake Patient education materials provided in AVS Reviewed ED and return precautions Follow up as needed for persistent or progressing symptoms     Bright red rectal bleeding            No follow-ups on file.   I, Mega Kinkade E Ellisha Bankson, PA-C, have reviewed all documentation for this visit. The documentation on 01/07/23 for the exam, diagnosis, procedures, and orders are all accurate and complete.   Jacquelin Hawking, MHS, PA-C [facility]

## 2023-01-27 ENCOUNTER — Other Ambulatory Visit: Payer: Self-pay | Admitting: Internal Medicine

## 2023-01-27 DIAGNOSIS — E782 Mixed hyperlipidemia: Secondary | ICD-10-CM

## 2023-01-27 NOTE — Telephone Encounter (Signed)
Requested Prescriptions  Pending Prescriptions Disp Refills   atorvastatin (LIPITOR) 10 MG tablet [Pharmacy Med Name: ATORVASTATIN 10 MG TABLET] 90 tablet 1    Sig: TAKE 1 TABLET BY MOUTH EVERY DAY     Cardiovascular:  Antilipid - Statins Failed - 01/27/2023  1:21 AM      Failed - Lipid Panel in normal range within the last 12 months    Cholesterol  Date Value Ref Range Status  08/05/2022 142 <200 mg/dL Final   LDL Cholesterol (Calc)  Date Value Ref Range Status  08/05/2022 79 mg/dL (calc) Final    Comment:    Reference range: <100 . Desirable range <100 mg/dL for primary prevention;   <70 mg/dL for patients with CHD or diabetic patients  with > or = 2 CHD risk factors. Marland Kitchen LDL-C is now calculated using the Martin-Hopkins  calculation, which is a validated novel method providing  better accuracy than the Friedewald equation in the  estimation of LDL-C.  Horald Pollen et al. Lenox Ahr. 1660;630(16): 2061-2068  (http://education.[employer].com/faq/FAQ164)    HDL  Date Value Ref Range Status  08/05/2022 35 (L) > OR = 40 mg/dL Final   Triglycerides  Date Value Ref Range Status  08/05/2022 184 (H) <150 mg/dL Final         Passed - Patient is not pregnant      Passed - Valid encounter within last 12 months    Recent Outpatient Visits           2 weeks ago Hemorrhoids, unspecified hemorrhoid type   [facility] Karisa Nesser Mecum, [place] E, PA-C   5 months ago Elevated random blood glucose level   [facility] [place], Salvadore [place], NP   8 months ago Trigger ring finger of right hand   [place] [facility] [place], Salvadore [place], NP   11 months ago Screening for colon cancer   [place] [facility] [place], Salvadore Oxford, NP   1 year ago Chest wall pain   [place] [facility] [place], Salvadore [place], [place]

## 2023-01-31 ENCOUNTER — Encounter: Payer: Self-pay | Admitting: Internal Medicine

## 2023-01-31 ENCOUNTER — Ambulatory Visit: Payer: Commercial Managed Care - PPO | Admitting: Internal Medicine

## 2023-01-31 ENCOUNTER — Other Ambulatory Visit: Payer: Self-pay | Admitting: Internal Medicine

## 2023-01-31 DIAGNOSIS — J441 Chronic obstructive pulmonary disease with (acute) exacerbation: Secondary | ICD-10-CM

## 2023-01-31 MED ORDER — PREDNISONE 10 MG PO TABS: ORAL_TABLET | ORAL | 0 refills | Status: AC

## 2023-01-31 MED ORDER — FLUTICASONE-UMECLIDIN-VILANT 100-62.5-25 MCG/ACT IN AEPB: 1.0000 | INHALATION_SPRAY | Freq: Every day | RESPIRATORY_TRACT | 1 refills | Status: DC

## 2023-01-31 NOTE — Patient Instructions (Signed)

## 2023-01-31 NOTE — Progress Notes (Signed)
Subjective:    Patient ID: Melvin Ramos, male    DOB: 08/08/1965, 57 y.o.   MRN: [id number]  HPI  Patient presents to clinic today with concern sore throat, cough and shortness of breath.  He reports this started 2 weeks ago.  He denies difficulty swallowing.  The cough is nonproductive.  The shortness of breath worsens with exertion.  He denies headache, runny nose, nasal congestion, ear pain, chest pain, nausea, vomiting or diarrhea.  He denies fever, chills or bodyaches.  He is out of his Trelegy and albuterol inhalers but has been using DuoNebs as needed.  He has a history of COPD but does not currently smoke.  He has not tried anything OTC for this.  Review of Systems     Past Medical History:  Diagnosis Date   COPD (chronic obstructive pulmonary disease) (HCC)    Hemorrhoid     Current Outpatient Medications  Medication Sig Dispense Refill   albuterol (VENTOLIN HFA) 108 (90 Base) MCG/ACT inhaler Inhale 1-2 puffs into the lungs every 6 (six) hours as needed for wheezing or shortness of breath. 8.5 g 2   atorvastatin (LIPITOR) 10 MG tablet TAKE 1 TABLET BY MOUTH EVERY DAY 90 tablet 0   fenofibrate (TRICOR) 48 MG tablet Take 1 tablet (48 mg total) by mouth daily. 90 tablet 1   ipratropium-albuterol (DUONEB) 0.5-2.5 (3) MG/3ML SOLN SMARTSIG:3 Milliliter(s) Via Nebulizer 4 Times Daily PRN     TRELEGY ELLIPTA 100-62.5-25 MCG/INH AEPB Inhale 1 puff into the lungs daily. 1 each 0   No current facility-administered medications for this visit.    No Known Allergies  Family History  Problem Relation Age of Onset   Depression Mother    Heart disease Mother    Stroke Mother     Social History   Socioeconomic History   Marital status: Single    Spouse name: Not on file   Number of children: Not on file   Years of education: Not on file   Highest education level: Not on file  Occupational History   Not on file  Tobacco Use   Smoking status: Former    Current packs/day: 0.00     Average packs/day: 2.0 packs/day for 40.0 years (80.0 ttl pk-yrs)    Types: Cigarettes    Start date: 07/09/1979    Quit date: 07/09/2019    Years since quitting: 3.5   Smokeless tobacco: Never  Vaping Use   Vaping status: Never Used  Substance and Sexual Activity   Alcohol use: Yes    Comment: "sometimes"   Drug use: No   Sexual activity: Not on file    Comment: not asked  Other Topics Concern   Not on file  Social History Narrative   Not on file   Social Determinants of Health   Financial Resource Strain: Not on file  Food Insecurity: Not on file  Transportation Needs: Not on file  Physical Activity: Not on file  Stress: Not on file  Social Connections: Not on file  Intimate Partner Violence: Not on file     Constitutional: Denies fever, malaise, fatigue, headache or abrupt weight changes.  HEENT: Patient reports sore throat.  Denies eye pain, eye redness, ear pain, ringing in the ears, wax buildup, runny nose, nasal congestion, bloody nose. Respiratory: Patient reports cough and shortness of breath.  Denies difficulty breathing, or sputum production.   Cardiovascular: Denies chest pain, chest tightness, palpitations or swelling in the hands or feet.  Gastrointestinal: Denies  abdominal pain, bloating, constipation, diarrhea or blood in the stool.   No other specific complaints in a complete review of systems (except as listed in HPI above).  Objective:   Physical Exam  BP 128/86 (BP Location: Right Arm, Patient Position: Sitting, Cuff Size: Normal)   Pulse 91   Temp (!) 96.8 F (36 C) (Temporal)   Wt 134 lb (60.8 kg)   SpO2 97%   BMI 21.63 kg/m   Wt Readings from Last 3 Encounters:  01/07/23 143 lb (64.9 kg)  08/05/22 146 lb (66.2 kg)  05/04/22 141 lb (64 kg)    General: Appears his stated age, well developed, well nourished in NAD. Skin: Warm, dry and intact.  HEENT: Head: normal shape and size; Eyes: sclera white, no icterus, conjunctiva pink, PERRLA and  EOMs intact;  Throat/Mouth: Teeth present, mucosa pink and moist, no exudate, lesions or ulcerations noted.  Neck: No adenopathy noted. Cardiovascular: Normal rate and rhythm. S1,S2 noted.  No murmur, rubs or gallops noted.  Pulmonary/Chest: Increased effort with vesicular breath sounds with bilateral expiratory wheezing.  No rales or rhonchi noted.  No respiratory distress. Musculoskeletal: No difficulty with gait.  Neurological: Alert and oriented.    BMET    Component Value Date/Time   NA 140 08/05/2022 0815   K 3.8 08/05/2022 0815   CL 107 08/05/2022 0815   CO2 26 08/05/2022 0815   GLUCOSE 74 08/05/2022 0815   BUN 18 08/05/2022 0815   CREATININE 0.87 08/05/2022 0815   CALCIUM 8.7 08/05/2022 0815   GFRNONAA 101 12/25/2019 0844   GFRAA 117 12/25/2019 0844    Lipid Panel     Component Value Date/Time   CHOL 142 08/05/2022 0815   TRIG 184 (H) 08/05/2022 0815   HDL 35 (L) 08/05/2022 0815   CHOLHDL 4.1 08/05/2022 0815   LDLCALC 79 08/05/2022 0815    CBC    Component Value Date/Time   WBC 4.2 08/05/2022 0815   RBC 4.67 08/05/2022 0815   HGB 14.7 08/05/2022 0815   HCT 42.5 08/05/2022 0815   PLT 194 08/05/2022 0815   MCV 91.0 08/05/2022 0815   MCH 31.5 08/05/2022 0815   MCHC 34.6 08/05/2022 0815   RDW 12.4 08/05/2022 0815   LYMPHSABS 2,059 12/25/2019 0844   MONOABS 0.6 07/09/2019 1127   EOSABS 330 12/25/2019 0844   BASOSABS 30 12/25/2019 0844    Hgb A1C Lab Results  Component Value Date   HGBA1C 5.6 08/05/2022           Assessment & Plan:   COPD exacerbation:  Rx for Pred taper x 6 days Trelegy refilled Continue albuterol as needed He will follow-up with pulmonology as previously scheduled Work note provided  RTC in 1 month for for your annual exam Nicki Reaper, NP

## 2023-02-01 ENCOUNTER — Ambulatory Visit: Payer: Commercial Managed Care - PPO | Admitting: Internal Medicine

## 2023-02-01 NOTE — Telephone Encounter (Signed)
Requested medication (s) are due for refill today: routing for review  Requested medication (s) are on the active medication list: yes  Last refill:  01/31/23  Future visit scheduled: yes  Notes to clinic:  Pharmacy comment: Script Clarification:QUANTITY COMES IN MULTIPLIES OF 60.      Requested Prescriptions  Pending Prescriptions Disp Refills   TRELEGY ELLIPTA 100-62.5-25 MCG/ACT AEPB [Pharmacy Med Name: TRELEGY ELLIPTA 100-62.5-25] 84 each 1    Sig: TAKE 1 PUFF BY MOUTH EVERY DAY     Off-Protocol Failed - 01/31/2023 11:23 AM      Failed - Medication not assigned to a protocol, review manually.      Passed - Valid encounter within last 12 months    Recent Outpatient Visits           Yesterday COPD exacerbation [facility])   [place] [facility] [place], Salvadore Oxford, NP   3 weeks ago Hemorrhoids, unspecified hemorrhoid type   [place] [facility], Oswaldo Conroy, PA-C   6 months ago Elevated random blood glucose level   [place] [facility] [place], Salvadore Oxford, NP   9 months ago Trigger ring finger of right hand   [facility] [place], Salvadore Oxford, NP   11 months ago Screening for colon cancer   [place] [facility] [place], Salvadore Oxford, NP       Future Appointments             In 3 weeks Sampson Si, Salvadore Oxford, NP  [facility], [facility]

## 2023-02-04 ENCOUNTER — Other Ambulatory Visit: Payer: Self-pay | Admitting: Internal Medicine

## 2023-02-04 ENCOUNTER — Ambulatory Visit: Payer: Commercial Managed Care - PPO | Admitting: Internal Medicine

## 2023-02-04 NOTE — Telephone Encounter (Signed)
Requested Prescriptions  Pending Prescriptions Disp Refills   fenofibrate (TRICOR) 48 MG tablet [Pharmacy Med Name: FENOFIBRATE 48 MG TABLET] 90 tablet 1    Sig: TAKE 1 TABLET BY MOUTH EVERY DAY     Cardiovascular:  Antilipid - Fibric Acid Derivatives Failed - 02/04/2023  1:35 AM      Failed - Lipid Panel in normal range within the last 12 months    Cholesterol  Date Value Ref Range Status  08/05/2022 142 <200 mg/dL Final   LDL Cholesterol (Calc)  Date Value Ref Range Status  08/05/2022 79 mg/dL (calc) Final    Comment:    Reference range: <100 . Desirable range <100 mg/dL for primary prevention;   <70 mg/dL for patients with CHD or diabetic patients  with > or = 2 CHD risk factors. Marland Kitchen LDL-C is now calculated using the Martin-Hopkins  calculation, which is a validated novel method providing  better accuracy than the Friedewald equation in the  estimation of LDL-C.  Horald Pollen et al. Lenox Ahr. 9562;130(86): 2061-2068  (http://education.[facility].com/faq/FAQ164)    HDL  Date Value Ref Range Status  08/05/2022 35 (L) > OR = 40 mg/dL Final   Triglycerides  Date Value Ref Range Status  08/05/2022 184 (H) <150 mg/dL Final         Passed - ALT in normal range and within 360 days    ALT  Date Value Ref Range Status  08/05/2022 23 9 - 46 U/L Final         Passed - AST in normal range and within 360 days    AST  Date Value Ref Range Status  08/05/2022 16 10 - 35 U/L Final         Passed - Cr in normal range and within 360 days    Creat  Date Value Ref Range Status  08/05/2022 0.87 0.70 - 1.30 mg/dL Final         Passed - HGB in normal range and within 360 days    Hemoglobin  Date Value Ref Range Status  08/05/2022 14.7 13.2 - 17.1 g/dL Final         Passed - HCT in normal range and within 360 days    HCT  Date Value Ref Range Status  08/05/2022 42.5 38.5 - 50.0 % Final         Passed - PLT in normal range and within 360 days    Platelets  Date Value Ref  Range Status  08/05/2022 194 140 - 400 Thousand/uL Final         Passed - WBC in normal range and within 360 days    WBC  Date Value Ref Range Status  08/05/2022 4.2 3.8 - 10.8 Thousand/uL Final         Passed - eGFR is 30 or above and within 360 days    GFR, Est African American  Date Value Ref Range Status  12/25/2019 117 > OR = 60 mL/min/1.68m2 Final   GFR, Est Non African American  Date Value Ref Range Status  12/25/2019 101 > OR = 60 mL/min/1.19m2 Final   eGFR  Date Value Ref Range Status  08/05/2022 100 > OR = 60 mL/min/1.23m2 Final         Passed - Valid encounter within last 12 months    Recent Outpatient Visits           4 days ago COPD exacerbation [facility])    [facility] [place], Salvadore [place], [place]  4 weeks ago Hemorrhoids, unspecified hemorrhoid type   [place] [facility], Oswaldo Conroy, PA-C   6 months ago Elevated random blood glucose level   [place] [facility] [place], Salvadore Oxford, NP   9 months ago Trigger ring finger of right hand   [place] [facility] [place], Salvadore Oxford, NP   12 months ago Screening for colon cancer   [place] [facility] [place], Salvadore Oxford, NP       Future Appointments             In 2 weeks Sampson Si, Salvadore Oxford, NP [place] [facility], [facility]

## 2023-02-24 ENCOUNTER — Encounter: Payer: Commercial Managed Care - PPO | Admitting: Internal Medicine

## 2023-04-23 ENCOUNTER — Other Ambulatory Visit: Payer: Self-pay | Admitting: Internal Medicine

## 2023-04-23 DIAGNOSIS — E782 Mixed hyperlipidemia: Secondary | ICD-10-CM

## 2023-04-25 NOTE — Telephone Encounter (Signed)
Requested Prescriptions  Pending Prescriptions Disp Refills   atorvastatin (LIPITOR) 10 MG tablet [Pharmacy Med Name: ATORVASTATIN 10 MG TABLET] 90 tablet 0    Sig: TAKE 1 TABLET BY MOUTH EVERY DAY     Cardiovascular:  Antilipid - Statins Failed - 04/23/2023  9:12 AM      Failed - Lipid Panel in normal range within the last 12 months    Cholesterol  Date Value Ref Range Status  08/05/2022 142 <200 mg/dL Final   LDL Cholesterol (Calc)  Date Value Ref Range Status  08/05/2022 79 mg/dL (calc) Final    Comment:    Reference range: <100 . Desirable range <100 mg/dL for primary prevention;   <70 mg/dL for patients with CHD or diabetic patients  with > or = 2 CHD risk factors. Marland Kitchen LDL-C is now calculated using the Martin-Hopkins  calculation, which is a validated novel method providing  better accuracy than the Friedewald equation in the  estimation of LDL-C.  Horald Pollen et al. Lenox Ahr. 1610;960(45): 2061-2068  (http://education.[facility].com/faq/FAQ164)    HDL  Date Value Ref Range Status  08/05/2022 35 (L) > OR = 40 mg/dL Final   Triglycerides  Date Value Ref Range Status  08/05/2022 184 (H) <150 mg/dL Final         Passed - Patient is not pregnant      Passed - Valid encounter within last 12 months    Recent Outpatient Visits           2 months ago COPD exacerbation [facility])   [place] [facility] [place], [place], NP   3 months ago Hemorrhoids, unspecified hemorrhoid type   [place] [facility], [place] E, PA-C   8 months ago Elevated random blood glucose level   [place] [facility] [place], Salvadore Oxford, NP   11 months ago Trigger ring finger of right hand   [place] [facility] [place], Salvadore Oxford, NP   1 year ago Screening for colon cancer   [place] [facility] [place], Salvadore Oxford, [place]

## 2023-07-21 ENCOUNTER — Other Ambulatory Visit: Payer: Self-pay | Admitting: Internal Medicine

## 2023-07-21 DIAGNOSIS — E782 Mixed hyperlipidemia: Secondary | ICD-10-CM

## 2023-07-21 NOTE — Telephone Encounter (Signed)
Requested Prescriptions  Pending Prescriptions Disp Refills   atorvastatin (LIPITOR) 10 MG tablet [Pharmacy Med Name: ATORVASTATIN 10 MG TABLET] 90 tablet 0    Sig: TAKE 1 TABLET BY MOUTH EVERY DAY     Cardiovascular:  Antilipid - Statins Failed - 07/21/2023  3:31 PM      Failed - Lipid Panel in normal range within the last 12 months    Cholesterol  Date Value Ref Range Status  08/05/2022 142 <200 mg/dL Final   LDL Cholesterol (Calc)  Date Value Ref Range Status  08/05/2022 79 mg/dL (calc) Final    Comment:    Reference range: <100 . Desirable range <100 mg/dL for primary prevention;   <70 mg/dL for patients with CHD or diabetic patients  with > or = 2 CHD risk factors. Marland Kitchen LDL-C is now calculated using the Martin-Hopkins  calculation, which is a validated novel method providing  better accuracy than the Friedewald equation in the  estimation of LDL-C.  Horald Pollen et al. Lenox Ahr. 2956;213(08): 2061-2068  (http://education.[facility].com/faq/FAQ164)    HDL  Date Value Ref Range Status  08/05/2022 35 (L) > OR = 40 mg/dL Final   Triglycerides  Date Value Ref Range Status  08/05/2022 184 (H) <150 mg/dL Final         Passed - Patient is not pregnant      Passed - Valid encounter within last 12 months    Recent Outpatient Visits           5 months ago COPD exacerbation [facility])   [place] [facility] [place], [place], NP   6 months ago Hemorrhoids, unspecified hemorrhoid type   [place] [facility], Oswaldo Conroy, [place]   11 months ago Elevated random blood glucose level   [place] [facility] [place], Salvadore Oxford, NP   1 year ago Trigger ring finger of right hand   [place] [facility] [place], Salvadore Oxford, NP   1 year ago Screening for colon cancer   [place] [facility] [place], Salvadore Oxford, [place]

## 2023-08-04 ENCOUNTER — Other Ambulatory Visit: Payer: Self-pay | Admitting: Internal Medicine

## 2023-08-04 NOTE — Telephone Encounter (Signed)
 Requested Prescriptions  Pending Prescriptions Disp Refills   fenofibrate (TRICOR) 48 MG tablet [Pharmacy Med Name: FENOFIBRATE 48 MG TABLET] 90 tablet 0    Sig: TAKE 1 TABLET BY MOUTH EVERY DAY     Cardiovascular:  Antilipid - Fibric Acid Derivatives Failed - 08/04/2023  3:59 PM      Failed - ALT in normal range and within 360 days    ALT  Date Value Ref Range Status  08/05/2022 23 9 - 46 U/L Final         Failed - AST in normal range and within 360 days    AST  Date Value Ref Range Status  08/05/2022 16 10 - 35 U/L Final         Failed - Cr in normal range and within 360 days    Creat  Date Value Ref Range Status  08/05/2022 0.87 0.70 - 1.30 mg/dL Final         Failed - HGB in normal range and within 360 days    Hemoglobin  Date Value Ref Range Status  08/05/2022 14.7 13.2 - 17.1 g/dL Final         Failed - HCT in normal range and within 360 days    HCT  Date Value Ref Range Status  08/05/2022 42.5 38.5 - 50.0 % Final         Failed - PLT in normal range and within 360 days    Platelets  Date Value Ref Range Status  08/05/2022 194 140 - 400 Thousand/uL Final         Failed - WBC in normal range and within 360 days    WBC  Date Value Ref Range Status  08/05/2022 4.2 3.8 - 10.8 Thousand/uL Final         Failed - eGFR is 30 or above and within 360 days    GFR, Est African American  Date Value Ref Range Status  12/25/2019 117 > OR = 60 mL/min/1.79m2 Final   GFR, Est Non African American  Date Value Ref Range Status  12/25/2019 101 > OR = 60 mL/min/1.66m2 Final   eGFR  Date Value Ref Range Status  08/05/2022 100 > OR = 60 mL/min/1.91m2 Final         Failed - Lipid Panel in normal range within the last 12 months    Cholesterol  Date Value Ref Range Status  08/05/2022 142 <200 mg/dL Final   LDL Cholesterol (Calc)  Date Value Ref Range Status  08/05/2022 79 mg/dL (calc) Final    Comment:    Reference range: <100 . Desirable range <100 mg/dL for  primary prevention;   <70 mg/dL for patients with CHD or diabetic patients  with > or = 2 CHD risk factors. Marland Kitchen LDL-C is now calculated using the Martin-Hopkins  calculation, which is a validated novel method providing  better accuracy than the Friedewald equation in the  estimation of LDL-C.  Horald Pollen et al. Lenox Ahr. 9563;875(64): 2061-2068  (http://education.[facility].com/faq/FAQ164)    HDL  Date Value Ref Range Status  08/05/2022 35 (L) > OR = 40 mg/dL Final   Triglycerides  Date Value Ref Range Status  08/05/2022 184 (H) <150 mg/dL Final         Passed - Valid encounter within last 12 months    Recent Outpatient Visits           6 months ago [facility] exacerbation [facility])    [facility] [place], Salvadore Oxford, [place]  6 months ago Hemorrhoids, unspecified hemorrhoid type   [place] [facility], Oswaldo Conroy, [place]   12 months ago Elevated random blood glucose level   [place] [facility], Salvadore Oxford, NP   1 year ago Trigger ring finger of right hand   [place] [facility] [place], Salvadore Oxford, NP   1 year ago Screening for colon cancer   [place] [facility] [place], Salvadore Oxford, [place]

## 2023-08-11 ENCOUNTER — Other Ambulatory Visit: Payer: Self-pay | Admitting: Acute Care

## 2023-08-11 DIAGNOSIS — Z122 Encounter for screening for malignant neoplasm of respiratory organs: Secondary | ICD-10-CM

## 2023-08-11 DIAGNOSIS — Z87891 Personal history of nicotine dependence: Secondary | ICD-10-CM

## 2023-09-05 ENCOUNTER — Ambulatory Visit
Admission: RE | Admit: 2023-09-05 | Discharge: 2023-09-05 | Disposition: A | Discharge status: Home / Self Care | Source: Ambulatory Visit | Attending: Internal Medicine | Admitting: Internal Medicine

## 2023-09-05 DIAGNOSIS — Z122 Encounter for screening for malignant neoplasm of respiratory organs: Secondary | ICD-10-CM | POA: Insufficient documentation

## 2023-09-05 DIAGNOSIS — Z87891 Personal history of nicotine dependence: Secondary | ICD-10-CM | POA: Insufficient documentation

## 2023-10-10 ENCOUNTER — Other Ambulatory Visit: Payer: Self-pay

## 2023-10-10 DIAGNOSIS — Z87891 Personal history of nicotine dependence: Secondary | ICD-10-CM

## 2023-10-10 DIAGNOSIS — Z122 Encounter for screening for malignant neoplasm of respiratory organs: Secondary | ICD-10-CM

## 2024-02-21 ENCOUNTER — Other Ambulatory Visit: Payer: Self-pay | Admitting: Internal Medicine

## 2024-02-21 DIAGNOSIS — E782 Mixed hyperlipidemia: Secondary | ICD-10-CM

## 2024-02-22 NOTE — Telephone Encounter (Signed)
 Unable to refill per protocol, appointment needed.   Requested Prescriptions  Pending Prescriptions Disp Refills   atorvastatin  (LIPITOR) 10 MG tablet [Pharmacy Med Name: ATORVASTATIN  10 MG TABLET] 90 tablet 0    Sig: TAKE 1 TABLET BY MOUTH EVERY DAY     Cardiovascular:  Antilipid - Statins Failed - 02/22/2024 11:45 AM      Failed - Valid encounter within last 12 months    Recent Outpatient Visits   None            Failed - Lipid Panel in normal range within the last 12 months    Cholesterol  Date Value Ref Range Status  08/05/2022 142 <200 mg/dL Final   LDL Cholesterol (Calc)  Date Value Ref Range Status  08/05/2022 79 mg/dL (calc) Final    Comment:    Reference range: <100 . Desirable range <100 mg/dL for primary prevention;   <70 mg/dL for patients with CHD or diabetic patients  with > or = 2 CHD risk factors. SABRA LDL-C is now calculated using the Martin-Hopkins  calculation, which is a validated novel method providing  better accuracy than the Friedewald equation in the  estimation of LDL-C.  Gladis APPLETHWAITE et al. SANDREA. 7986;689(80): 2061-2068  (http://education.[facility].com/faq/FAQ164)    HDL  Date Value Ref Range Status  08/05/2022 35 (L) > OR = 40 mg/dL Final   Triglycerides  Date Value Ref Range Status  08/05/2022 184 (H) <150 mg/dL Final         Passed - Patient is not pregnant       fenofibrate  (TRICOR ) 48 MG tablet [Pharmacy Med Name: FENOFIBRATE  48 MG TABLET] 90 tablet 0    Sig: TAKE 1 TABLET BY MOUTH EVERY DAY     Cardiovascular:  Antilipid - Fibric Acid Derivatives Failed - 02/22/2024 11:45 AM      Failed - ALT in normal range and within 360 days    ALT  Date Value Ref Range Status  08/05/2022 23 9 - 46 U/L Final         Failed - AST in normal range and within 360 days    AST  Date Value Ref Range Status  08/05/2022 16 10 - 35 U/L Final         Failed - Cr in normal range and within 360 days    Creat  Date Value Ref Range Status   08/05/2022 0.87 0.70 - 1.30 mg/dL Final         Failed - HGB in normal range and within 360 days    Hemoglobin  Date Value Ref Range Status  08/05/2022 14.7 13.2 - 17.1 g/dL Final         Failed - HCT in normal range and within 360 days    HCT  Date Value Ref Range Status  08/05/2022 42.5 38.5 - 50.0 % Final         Failed - PLT in normal range and within 360 days    Platelets  Date Value Ref Range Status  08/05/2022 194 140 - 400 Thousand/uL Final         Failed - WBC in normal range and within 360 days    WBC  Date Value Ref Range Status  08/05/2022 4.2 3.8 - 10.8 Thousand/uL Final         Failed - eGFR is 30 or above and within 360 days    GFR, Est African American  Date Value Ref Range Status  12/25/2019 117 > OR =  60 mL/min/1.25m2 Final   GFR, Est Non African American  Date Value Ref Range Status  12/25/2019 101 > OR = 60 mL/min/1.34m2 Final   eGFR  Date Value Ref Range Status  08/05/2022 100 > OR = 60 mL/min/1.61m2 Final         Failed - Valid encounter within last 12 months    Recent Outpatient Visits   None            Failed - Lipid Panel in normal range within the last 12 months    Cholesterol  Date Value Ref Range Status  08/05/2022 142 <200 mg/dL Final   LDL Cholesterol (Calc)  Date Value Ref Range Status  08/05/2022 79 mg/dL (calc) Final    Comment:    Reference range: <100 . Desirable range <100 mg/dL for primary prevention;   <70 mg/dL for patients with CHD or diabetic patients  with > or = 2 CHD risk factors. SABRA LDL-C is now calculated using the Martin-Hopkins  calculation, which is a validated novel method providing  better accuracy than the Friedewald equation in the  estimation of LDL-C.  Gladis APPLETHWAITE et al. SANDREA. 7986;689(80): 2061-2068  (http://education.[facility].com/faq/FAQ164)    HDL  Date Value Ref Range Status  08/05/2022 35 (L) > OR = 40 mg/dL Final   Triglycerides  Date Value Ref Range Status  08/05/2022 184  (H) <150 mg/dL Final
